# Patient Record
Sex: Female | Born: 1950
Health system: Southern US, Community
[De-identification: ages and names within clinical notes are randomized; demographics above are authoritative.]

## PROBLEM LIST (undated history)

## (undated) DIAGNOSIS — T7840XA Allergy, unspecified, initial encounter: Secondary | ICD-10-CM

## (undated) DIAGNOSIS — I10 Essential (primary) hypertension: Secondary | ICD-10-CM

## (undated) DIAGNOSIS — M1990 Unspecified osteoarthritis, unspecified site: Secondary | ICD-10-CM

## (undated) DIAGNOSIS — E785 Hyperlipidemia, unspecified: Secondary | ICD-10-CM

## (undated) DIAGNOSIS — K219 Gastro-esophageal reflux disease without esophagitis: Secondary | ICD-10-CM

## (undated) HISTORY — PX: COLONOSCOPY: SHX174

## (undated) HISTORY — DX: Allergy, unspecified, initial encounter: T78.40XA

## (undated) HISTORY — PX: WISDOM TOOTH EXTRACTION: SHX21

## (undated) HISTORY — DX: Gastro-esophageal reflux disease without esophagitis: K21.9

## (undated) HISTORY — DX: Unspecified osteoarthritis, unspecified site: M19.90

## (undated) HISTORY — DX: Hyperlipidemia, unspecified: E78.5

## (undated) HISTORY — PX: UPPER GASTROINTESTINAL ENDOSCOPY: SHX188

---

## 1999-05-22 ENCOUNTER — Encounter: Payer: Self-pay | Admitting: Emergency Medicine

## 1999-05-22 ENCOUNTER — Emergency Department (HOSPITAL_COMMUNITY): Admission: EM | Admit: 1999-05-22 | Discharge: 1999-05-22 | Payer: Self-pay | Admitting: Emergency Medicine

## 2001-02-16 ENCOUNTER — Encounter: Admission: RE | Admit: 2001-02-16 | Discharge: 2001-02-16 | Payer: Self-pay | Admitting: Family Medicine

## 2001-02-16 ENCOUNTER — Encounter: Payer: Self-pay | Admitting: Family Medicine

## 2003-11-02 ENCOUNTER — Emergency Department (HOSPITAL_COMMUNITY): Admission: EM | Admit: 2003-11-02 | Discharge: 2003-11-02 | Payer: Self-pay | Admitting: Emergency Medicine

## 2004-01-23 ENCOUNTER — Ambulatory Visit: Payer: Self-pay | Admitting: Internal Medicine

## 2004-01-31 ENCOUNTER — Ambulatory Visit: Payer: Self-pay | Admitting: Internal Medicine

## 2005-02-10 ENCOUNTER — Other Ambulatory Visit: Admission: RE | Admit: 2005-02-10 | Discharge: 2005-02-10 | Payer: Self-pay | Admitting: Family Medicine

## 2005-05-30 ENCOUNTER — Encounter: Admission: RE | Admit: 2005-05-30 | Discharge: 2005-05-30 | Payer: Self-pay | Admitting: Family Medicine

## 2006-03-01 IMAGING — CR DG CHEST 2V
2 series · 2 of 2 positions shown · non-contrast
Comparison: none

CLINICAL DATA: Mid chest pain.  Hypertension.
 PA AND LATERAL CHEST
 No comparison.  
 Heart size top normal. Minimal peribronchial thickening.  No significant infiltrate, or congestive heart failure.  No pneumothorax.
 IMPRESSION
 No evidence of infiltrate or congestive heart failure.

[view not recorded (1 of 2)]
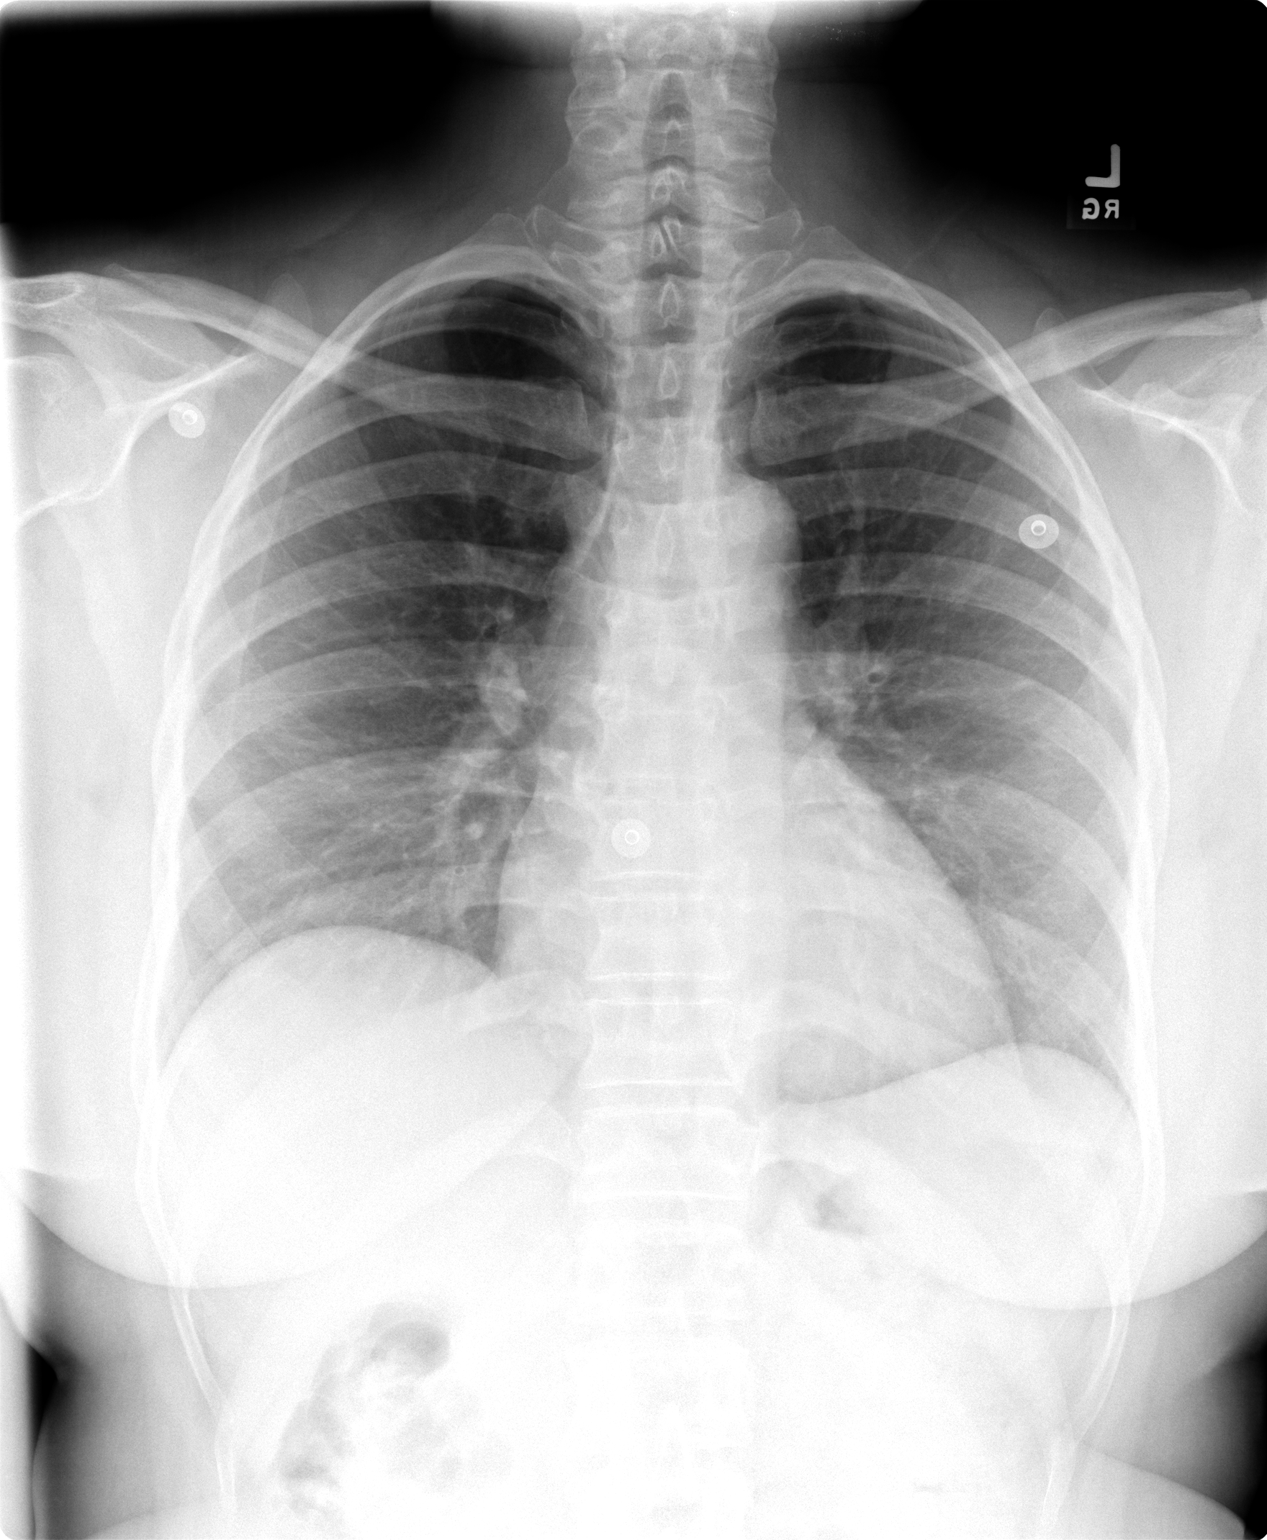

[view not recorded (2 of 2)]
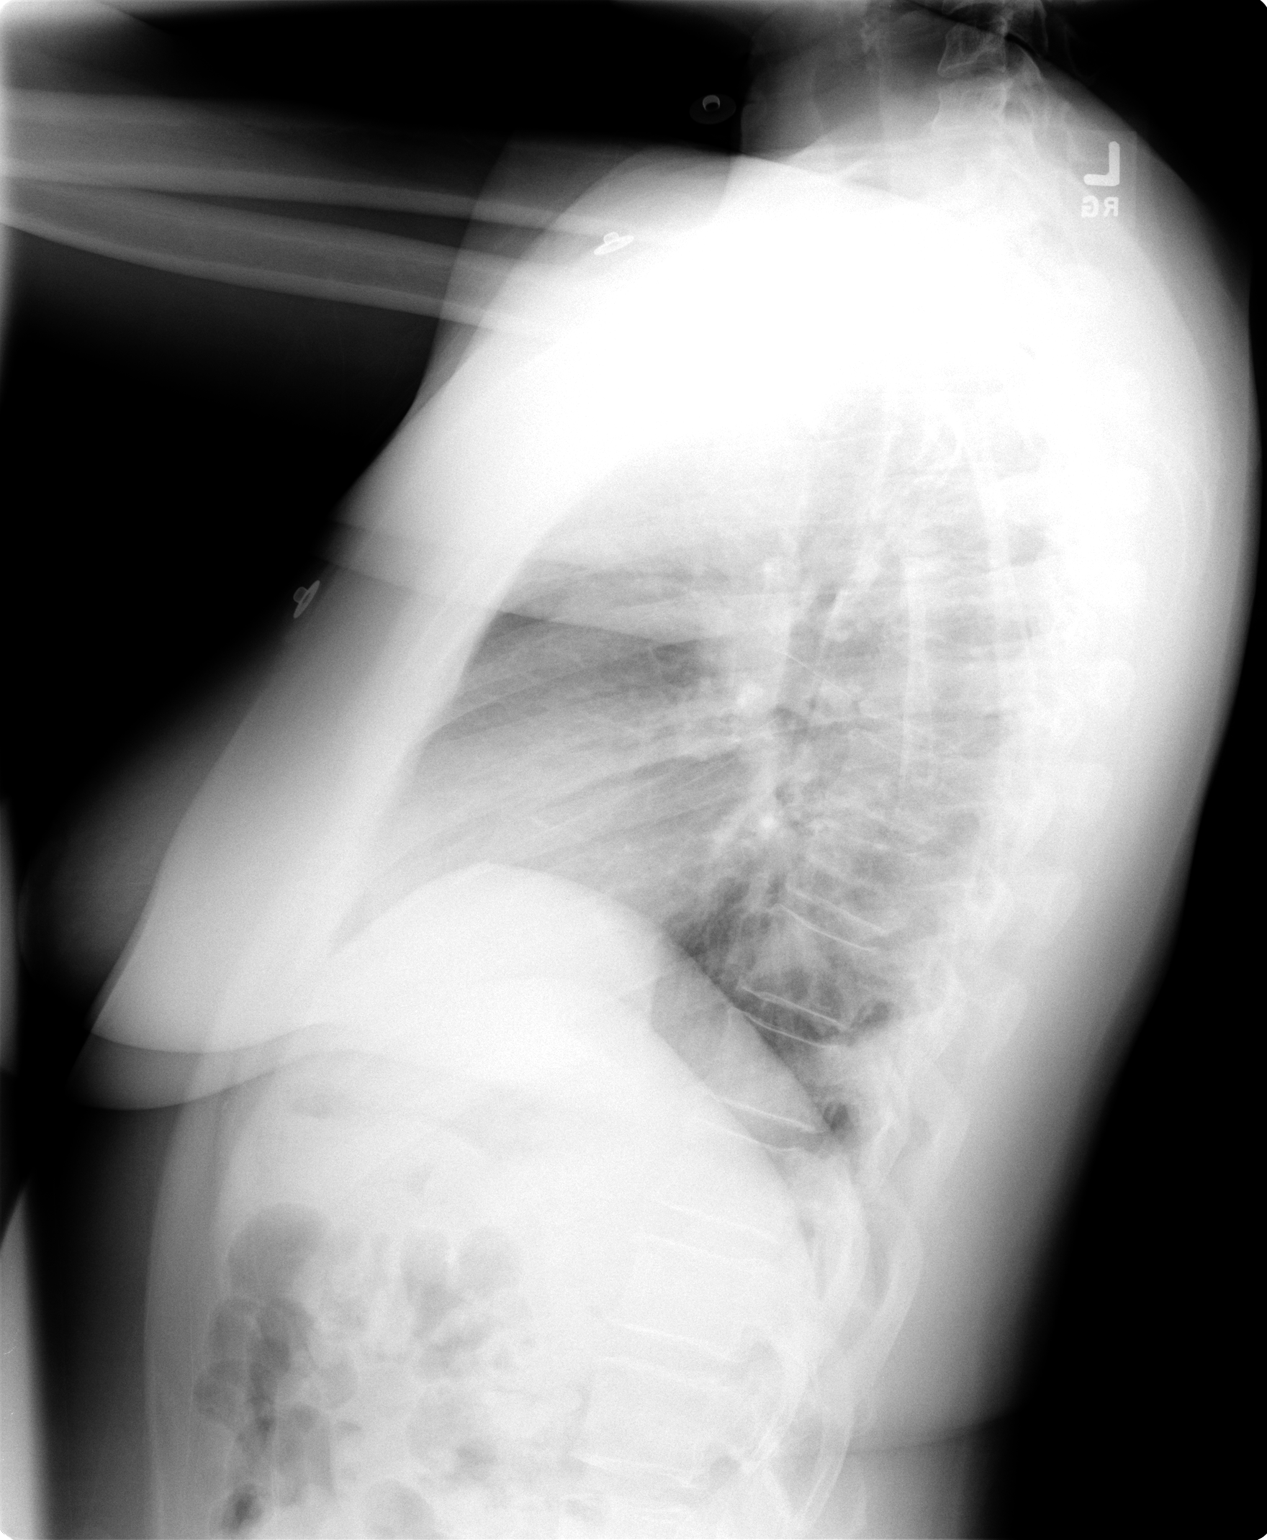

[2 of 2 positions shown; findings below may reference images not displayed]

## 2006-05-18 ENCOUNTER — Other Ambulatory Visit: Admission: RE | Admit: 2006-05-18 | Discharge: 2006-05-18 | Payer: Self-pay | Admitting: Family Medicine

## 2007-08-05 ENCOUNTER — Other Ambulatory Visit: Admission: RE | Admit: 2007-08-05 | Discharge: 2007-08-05 | Payer: Self-pay | Admitting: Family Medicine

## 2008-06-20 ENCOUNTER — Other Ambulatory Visit: Admission: RE | Admit: 2008-06-20 | Discharge: 2008-06-20 | Payer: Self-pay | Admitting: Family Medicine

## 2008-06-23 ENCOUNTER — Encounter: Admission: RE | Admit: 2008-06-23 | Discharge: 2008-06-23 | Payer: Self-pay | Admitting: Family Medicine

## 2009-12-30 ENCOUNTER — Emergency Department (HOSPITAL_COMMUNITY): Admission: EM | Admit: 2009-12-30 | Discharge: 2009-12-31 | Payer: Self-pay | Admitting: Emergency Medicine

## 2010-05-22 LAB — DIFFERENTIAL
Eosinophils Relative: 0 % (ref 0–5)
Lymphocytes Relative: 12 % (ref 12–46)
Lymphs Abs: 1.9 10*3/uL (ref 0.7–4.0)
Monocytes Absolute: 0.9 10*3/uL (ref 0.1–1.0)
Monocytes Relative: 5 % (ref 3–12)

## 2010-05-22 LAB — POCT I-STAT, CHEM 8
BUN: 14 mg/dL (ref 6–23)
Calcium, Ion: 1.12 mmol/L (ref 1.12–1.32)
Chloride: 101 mEq/L (ref 96–112)
Creatinine, Ser: 1.2 mg/dL (ref 0.4–1.2)
Glucose, Bld: 179 mg/dL — ABNORMAL HIGH (ref 70–99)
TCO2: 25 mmol/L (ref 0–100)

## 2010-05-22 LAB — CBC
HCT: 37.2 % (ref 36.0–46.0)
Hemoglobin: 13 g/dL (ref 12.0–15.0)
MCH: 33.9 pg (ref 26.0–34.0)
MCHC: 34.9 g/dL (ref 30.0–36.0)
MCV: 97.1 fL (ref 78.0–100.0)
Platelets: 203 10*3/uL (ref 150–400)
RBC: 3.83 MIL/uL — ABNORMAL LOW (ref 3.87–5.11)
RDW: 13 % (ref 11.5–15.5)
WBC: 16.8 10*3/uL — ABNORMAL HIGH (ref 4.0–10.5)

## 2010-10-25 ENCOUNTER — Other Ambulatory Visit: Payer: Self-pay | Admitting: Family Medicine

## 2010-10-25 DIAGNOSIS — Z1231 Encounter for screening mammogram for malignant neoplasm of breast: Secondary | ICD-10-CM

## 2010-10-25 DIAGNOSIS — Z780 Asymptomatic menopausal state: Secondary | ICD-10-CM

## 2010-11-19 ENCOUNTER — Other Ambulatory Visit: Payer: Self-pay

## 2010-11-19 ENCOUNTER — Ambulatory Visit: Payer: Self-pay

## 2010-11-27 ENCOUNTER — Ambulatory Visit
Admission: RE | Admit: 2010-11-27 | Discharge: 2010-11-27 | Disposition: A | Discharge status: Home / Self Care | Payer: BC Managed Care – PPO | Source: Ambulatory Visit | Attending: Family Medicine | Admitting: Family Medicine

## 2010-11-27 DIAGNOSIS — Z780 Asymptomatic menopausal state: Secondary | ICD-10-CM

## 2010-11-27 DIAGNOSIS — Z1231 Encounter for screening mammogram for malignant neoplasm of breast: Secondary | ICD-10-CM

## 2013-02-15 ENCOUNTER — Other Ambulatory Visit (HOSPITAL_COMMUNITY)
Admission: RE | Admit: 2013-02-15 | Discharge: 2013-02-15 | Disposition: A | Discharge status: Home / Self Care | Payer: BC Managed Care – PPO | Source: Ambulatory Visit | Attending: Family Medicine | Admitting: Family Medicine

## 2013-02-15 ENCOUNTER — Other Ambulatory Visit: Payer: Self-pay | Admitting: Family Medicine

## 2013-02-15 DIAGNOSIS — Z0000 Encounter for general adult medical examination without abnormal findings: Secondary | ICD-10-CM | POA: Insufficient documentation

## 2013-02-25 ENCOUNTER — Other Ambulatory Visit: Payer: Self-pay | Admitting: Obstetrics & Gynecology

## 2014-01-26 ENCOUNTER — Emergency Department (INDEPENDENT_AMBULATORY_CARE_PROVIDER_SITE_OTHER)
Admission: EM | Admit: 2014-01-26 | Discharge: 2014-01-26 | Disposition: A | Discharge status: Home / Self Care | Payer: BC Managed Care – PPO | Source: Home / Self Care | Attending: Emergency Medicine | Admitting: Emergency Medicine

## 2014-01-26 ENCOUNTER — Encounter (HOSPITAL_COMMUNITY): Payer: Self-pay | Admitting: *Deleted

## 2014-01-26 DIAGNOSIS — H6012 Cellulitis of left external ear: Secondary | ICD-10-CM

## 2014-01-26 HISTORY — DX: Essential (primary) hypertension: I10

## 2014-01-26 MED ORDER — CLOTRIMAZOLE-BETAMETHASONE 1-0.05 % EX CREA: TOPICAL_CREAM | CUTANEOUS | Status: DC

## 2014-01-26 MED ORDER — CLINDAMYCIN HCL 300 MG PO CAPS: 300.0000 mg | ORAL_CAPSULE | Freq: Three times a day (TID) | ORAL | Status: DC

## 2014-01-26 NOTE — ED Provider Notes (Signed)
CSN: [id number]     Arrival date & time 01/26/14  1028 History   First MD Initiated Contact with Patient 01/26/14 1049     Chief Complaint  Patient presents with  . Otalgia   (Consider location/radiation/quality/duration/timing/severity/associated sxs/prior Treatment) HPI         63 year old female presents complaining of right ear pain, swelling, and drainage. This started as a small red spot on the outside of her right ear 3 weeks ago. This has spread around the entire area behind her ear and there is redness spreading down onto her neck below her ear. The area is draining, and minimally painful and tender. No systemic symptoms. No treatment tried at home.  Past Medical History  Diagnosis Date  . Hypertension    History reviewed. No pertinent past surgical history. History reviewed. No pertinent family history. History  Substance Use Topics  . Smoking status: Current Every Day Smoker  . Smokeless tobacco: Not on file  . Alcohol Use: No   OB History    No data available     Review of Systems  Constitutional: Negative for fever and chills.  HENT: Positive for ear pain (and swelling).   Neurological: Negative for headaches.  All other systems reviewed and are negative.   Allergies  Penicillins  Home Medications   Prior to Admission medications   Medication Sig Start Date End Date Taking? Authorizing Provider  clindamycin (CLEOCIN) 300 MG capsule Take 1 capsule (300 mg total) by mouth 3 (three) times daily. 01/26/14   Liam Graham, PA-C  clotrimazole-betamethasone (LOTRISONE) cream Apply to affected area 2 times daily prn 01/26/14   Liam Graham, PA-C   BP 170/93 mmHg  Pulse 78  Temp(Src) 98.9 F (37.2 C) (Oral)  Resp 16  SpO2 98% Physical Exam  Constitutional: She is oriented to person, place, and time. Vital signs are normal. She appears well-developed and well-nourished. No distress.  HENT:  Head: Normocephalic and atraumatic.  Left Ear: Hearing, tympanic  membrane and ear canal normal. There is drainage and swelling (the external ear is swollen, with drainage behind the ear. This skin on the ear is ulcerated with crusting. There is erythema extending inferiorly to the ear).  Pulmonary/Chest: Effort normal. No respiratory distress.  Lymphadenopathy:       Head (left side): No preauricular and no posterior auricular adenopathy present.    She has no cervical adenopathy.  Neurological: She is alert and oriented to person, place, and time. She has normal strength. Coordination normal.  Skin: Skin is warm and dry. No rash noted. She is not diaphoretic.  Psychiatric: She has a normal mood and affect. Judgment normal.  Nursing note and vitals reviewed.   ED Course  Procedures (including critical care time) Labs Review Labs Reviewed  WOUND CULTURE    Imaging Review No results found.   MDM   1. Cellulitis of ear, left    Cellulitis with superficial skin infection versus fungal infection, treat with Lotrisone and with clindamycin. Wound culture has been sent. Follow-up if any worsening for incomplete resolution by the end of the antibiotics  Meds ordered this encounter  Medications  . clindamycin (CLEOCIN) 300 MG capsule    Sig: Take 1 capsule (300 mg total) by mouth 3 (three) times daily.    Dispense:  21 capsule    Refill:  0    Order Specific Question:  Supervising Provider    Answer:  Billy Fischer [phone]  . clotrimazole-betamethasone (LOTRISONE) cream  Sig: Apply to affected area 2 times daily prn    Dispense:  30 g    Refill:  0    Order Specific Question:  Supervising Provider    Answer:  Ihor Gully D [place], PA-C 01/26/14 1420

## 2014-01-26 NOTE — ED Notes (Signed)
l  Earache  With  Swelling   X      3   Weeks

## 2014-01-26 NOTE — Discharge Instructions (Signed)

## 2014-01-28 LAB — WOUND CULTURE: GRAM STAIN: NONE SEEN

## 2014-01-29 NOTE — ED Notes (Addendum)
Wound culture L ear: few staph species ( coagulase neg.).  Pt. treated with Cleocin.  OK per Archie Balboa PA. Roselyn Meier 01/29/2014

## 2014-02-20 ENCOUNTER — Encounter: Payer: Self-pay | Admitting: Internal Medicine

## 2014-05-30 ENCOUNTER — Other Ambulatory Visit (INDEPENDENT_AMBULATORY_CARE_PROVIDER_SITE_OTHER): Payer: BLUE CROSS/BLUE SHIELD

## 2014-05-30 ENCOUNTER — Ambulatory Visit (INDEPENDENT_AMBULATORY_CARE_PROVIDER_SITE_OTHER): Payer: BLUE CROSS/BLUE SHIELD | Admitting: Internal Medicine

## 2014-05-30 ENCOUNTER — Encounter: Payer: Self-pay | Admitting: Internal Medicine

## 2014-05-30 VITALS — BP 170/70 | HR 85 | Temp 98.3°F | Resp 18 | Ht 66.0 in | Wt 145.8 lb

## 2014-05-30 DIAGNOSIS — E785 Hyperlipidemia, unspecified: Secondary | ICD-10-CM

## 2014-05-30 DIAGNOSIS — Z1239 Encounter for other screening for malignant neoplasm of breast: Secondary | ICD-10-CM | POA: Diagnosis not present

## 2014-05-30 DIAGNOSIS — Z1211 Encounter for screening for malignant neoplasm of colon: Secondary | ICD-10-CM | POA: Diagnosis not present

## 2014-05-30 DIAGNOSIS — F329 Major depressive disorder, single episode, unspecified: Secondary | ICD-10-CM

## 2014-05-30 DIAGNOSIS — I10 Essential (primary) hypertension: Secondary | ICD-10-CM

## 2014-05-30 DIAGNOSIS — F32A Depression, unspecified: Secondary | ICD-10-CM

## 2014-05-30 LAB — COMPREHENSIVE METABOLIC PANEL
ALBUMIN: 4 g/dL (ref 3.5–5.2)
ALK PHOS: 74 U/L (ref 39–117)
ALT: 28 U/L (ref 0–35)
AST: 28 U/L (ref 0–37)
BILIRUBIN TOTAL: 0.5 mg/dL (ref 0.2–1.2)
BUN: 11 mg/dL (ref 6–23)
CO2: 30 mEq/L (ref 19–32)
Calcium: 9.7 mg/dL (ref 8.4–10.5)
Chloride: 104 mEq/L (ref 96–112)
Creatinine, Ser: 0.78 mg/dL (ref 0.40–1.20)
GFR: 95.31 mL/min (ref >60.00)
Glucose, Bld: 94 mg/dL (ref 70–99)
POTASSIUM: 4.1 meq/L (ref 3.5–5.1)
Sodium: 137 mEq/L (ref 135–145)
Total Protein: 8.3 g/dL (ref 6.0–8.3)

## 2014-05-30 LAB — LIPID PANEL
CHOLESTEROL: 188 mg/dL (ref 0–200)
HDL: 57.6 mg/dL (ref >39.00)
LDL Cholesterol: 120 mg/dL — ABNORMAL HIGH (ref 0–99)
NONHDL: 130.4
TRIGLYCERIDES: 54 mg/dL (ref 0.0–149.0)
Total CHOL/HDL Ratio: 3
VLDL: 10.8 mg/dL (ref 0.0–40.0)

## 2014-05-30 LAB — CBC
HEMATOCRIT: 38.2 % (ref 36.0–46.0)
HEMOGLOBIN: 13.1 g/dL (ref 12.0–15.0)
MCHC: 34.4 g/dL (ref 30.0–36.0)
MCV: 97.7 fl (ref 78.0–100.0)
PLATELETS: 204 10*3/uL (ref 150.0–400.0)
RBC: 3.91 Mil/uL (ref 3.87–5.11)
RDW: 14 % (ref 11.5–15.5)
WBC: 8.9 10*3/uL (ref 4.0–10.5)

## 2014-05-30 MED ORDER — LISINOPRIL-HYDROCHLOROTHIAZIDE 20-12.5 MG PO TABS: 1.0000 | ORAL_TABLET | Freq: Every day | ORAL | Status: DC

## 2014-05-30 MED ORDER — CITALOPRAM HYDROBROMIDE 20 MG PO TABS: 20.0000 mg | ORAL_TABLET | Freq: Every day | ORAL | Status: DC

## 2014-05-30 MED ORDER — ATORVASTATIN CALCIUM 20 MG PO TABS: 20.0000 mg | ORAL_TABLET | Freq: Every day | ORAL | Status: DC

## 2014-05-30 NOTE — Patient Instructions (Signed)
We have sent in the blood pressure, cholesterol, and the celexa for you today.   We will check on the blood work today to make sure they are still doing well.   We will see you back in about 3-4 months to make sure you are doing well on the medicines.   We have sent in for the mammogram and you should hear back about that.   Health Maintenance Adopting a healthy lifestyle and getting preventive care can go a long way to promote health and wellness. Talk with your health care provider about what schedule of regular examinations is right for you. This is a good chance for you to check in with your provider about disease prevention and staying healthy. In between checkups, there are plenty of things you can do on your own. Experts have done a lot of research about which lifestyle changes and preventive measures are most likely to keep you healthy. Ask your health care provider for more information. WEIGHT AND DIET  Eat a healthy diet  Be sure to include plenty of vegetables, fruits, low-fat dairy products, and lean protein.  Do not eat a lot of foods high in solid fats, added sugars, or salt.  Get regular exercise. This is one of the most important things you can do for your health.  Most adults should exercise for at least 150 minutes each week. The exercise should increase your heart rate and make you sweat (moderate-intensity exercise).  Most adults should also do strengthening exercises at least twice a week. This is in addition to the moderate-intensity exercise.  Maintain a healthy weight  Body mass index (BMI) is a measurement that can be used to identify possible weight problems. It estimates body fat based on height and weight. Your health care provider can help determine your BMI and help you achieve or maintain a healthy weight.  For females 27 years of age and older:   A BMI below 18.5 is considered underweight.  A BMI of 18.5 to 24.9 is normal.  A BMI of 25 to 29.9 is  considered overweight.  A BMI of 30 and above is considered obese.  Watch levels of cholesterol and blood lipids  You should start having your blood tested for lipids and cholesterol at 65 years of age, then have this test every 5 years.  You may need to have your cholesterol levels checked more often if:  Your lipid or cholesterol levels are high.  You are older than 65 years of age.  You are at high risk for heart disease.  CANCER SCREENING   Lung Cancer  Lung cancer screening is recommended for adults 28-38 years old who are at high risk for lung cancer because of a history of smoking.  A yearly low-dose CT scan of the lungs is recommended for people who:  Currently smoke.  Have quit within the past 15 years.  Have at least a 30-pack-year history of smoking. A pack year is smoking an average of one pack of cigarettes a day for 1 year.  Yearly screening should continue until it has been 15 years since you quit.  Yearly screening should stop if you develop a health problem that would prevent you from having lung cancer treatment.  Breast Cancer  Practice breast self-awareness. This means understanding how your breasts normally appear and feel.  It also means doing regular breast self-exams. Let your health care provider know about any changes, no matter how small.  If you are in  your 20s or 30s, you should have a clinical breast exam (CBE) by a health care provider every 1-3 years as part of a regular health exam.  If you are 27 or older, have a CBE every year. Also consider having a breast X-ray (mammogram) every year.  If you have a family history of breast cancer, talk to your health care provider about genetic screening.  If you are at high risk for breast cancer, talk to your health care provider about having an MRI and a mammogram every year.  Breast cancer gene (BRCA) assessment is recommended for women who have family members with BRCA-related cancers.  BRCA-related cancers include:  Breast.  Ovarian.  Tubal.  Peritoneal cancers.  Results of the assessment will determine the need for genetic counseling and BRCA1 and BRCA2 testing. Cervical Cancer Routine pelvic examinations to screen for cervical cancer are no longer recommended for nonpregnant women who are considered low risk for cancer of the pelvic organs (ovaries, uterus, and vagina) and who do not have symptoms. A pelvic examination may be necessary if you have symptoms including those associated with pelvic infections. Ask your health care provider if a screening pelvic exam is right for you.   The Pap test is the screening test for cervical cancer for women who are considered at risk.  If you had a hysterectomy for a problem that was not cancer or a condition that could lead to cancer, then you no longer need Pap tests.  If you are older than 65 years, and you have had normal Pap tests for the past 10 years, you no longer need to have Pap tests.  If you have had past treatment for cervical cancer or a condition that could lead to cancer, you need Pap tests and screening for cancer for at least 20 years after your treatment.  If you no longer get a Pap test, assess your risk factors if they change (such as having a new sexual partner). This can affect whether you should start being screened again.  Some women have medical problems that increase their chance of getting cervical cancer. If this is the case for you, your health care provider may recommend more frequent screening and Pap tests.  The human papillomavirus (HPV) test is another test that may be used for cervical cancer screening. The HPV test looks for the virus that can cause cell changes in the cervix. The cells collected during the Pap test can be tested for HPV.  The HPV test can be used to screen women 39 years of age and older. Getting tested for HPV can extend the interval between normal Pap tests from three to  five years.  An HPV test also should be used to screen women of any age who have unclear Pap test results.  After 65 years of age, women should have HPV testing as often as Pap tests.  Colorectal Cancer  This type of cancer can be detected and often prevented.  Routine colorectal cancer screening usually begins at 65 years of age and continues through 65 years of age.  Your health care provider may recommend screening at an earlier age if you have risk factors for colon cancer.  Your health care provider may also recommend using home test kits to check for hidden blood in the stool.  A small camera at the end of a tube can be used to examine your colon directly (sigmoidoscopy or colonoscopy). This is done to check for the earliest forms of  colorectal cancer.  Routine screening usually begins at age 18.  Direct examination of the colon should be repeated every 5-10 years through 65 years of age. However, you may need to be screened more often if early forms of precancerous polyps or small growths are found. Skin Cancer  Check your skin from head to toe regularly.  Tell your health care provider about any new moles or changes in moles, especially if there is a change in a mole's shape or color.  Also tell your health care provider if you have a mole that is larger than the size of a pencil eraser.  Always use sunscreen. Apply sunscreen liberally and repeatedly throughout the day.  Protect yourself by wearing long sleeves, pants, a wide-brimmed hat, and sunglasses whenever you are outside. HEART DISEASE, DIABETES, AND HIGH BLOOD PRESSURE   Have your blood pressure checked at least every 1-2 years. High blood pressure causes heart disease and increases the risk of stroke.  If you are between 34 years and 64 years old, ask your health care provider if you should take aspirin to prevent strokes.  Have regular diabetes screenings. This involves taking a blood sample to check your  fasting blood sugar level.  If you are at a normal weight and have a low risk for diabetes, have this test once every three years after 65 years of age.  If you are overweight and have a high risk for diabetes, consider being tested at a younger age or more often. PREVENTING INFECTION  Hepatitis B  If you have a higher risk for hepatitis B, you should be screened for this virus. You are considered at high risk for hepatitis B if:  You were born in a country where hepatitis B is common. Ask your health care provider which countries are considered high risk.  Your parents were born in a high-risk country, and you have not been immunized against hepatitis B (hepatitis B vaccine).  You have HIV or AIDS.  You use needles to inject street drugs.  You live with someone who has hepatitis B.  You have had sex with someone who has hepatitis B.  You get hemodialysis treatment.  You take certain medicines for conditions, including cancer, organ transplantation, and autoimmune conditions. Hepatitis C  Blood testing is recommended for:  Everyone born from [birth year] through 1966.  Anyone with known risk factors for hepatitis C. Sexually transmitted infections (STIs)  You should be screened for sexually transmitted infections (STIs) including gonorrhea and chlamydia if:  You are sexually active and are younger than 65 years of age.  You are older than 65 years of age and your health care provider tells you that you are at risk for this type of infection.  Your sexual activity has changed since you were last screened and you are at an increased risk for chlamydia or gonorrhea. Ask your health care provider if you are at risk.  If you do not have HIV, but are at risk, it may be recommended that you take a prescription medicine daily to prevent HIV infection. This is called pre-exposure prophylaxis (PrEP). You are considered at risk if:  You are sexually active and do not regularly use condoms or  know the HIV status of your partner(s).  You take drugs by injection.  You are sexually active with a partner who has HIV. Talk with your health care provider about whether you are at high risk of being infected with HIV. If you choose to begin PrEP,  you should first be tested for HIV. You should then be tested every 3 months for as long as you are taking PrEP.  PREGNANCY   If you are premenopausal and you may become pregnant, ask your health care provider about preconception counseling.  If you may become pregnant, take 400 to 800 micrograms (mcg) of folic acid every day.  If you want to prevent pregnancy, talk to your health care provider about birth control (contraception). OSTEOPOROSIS AND MENOPAUSE   Osteoporosis is a disease in which the bones lose minerals and strength with aging. This can result in serious bone fractures. Your risk for osteoporosis can be identified using a bone density scan.  If you are 75 years of age or older, or if you are at risk for osteoporosis and fractures, ask your health care provider if you should be screened.  Ask your health care provider whether you should take a calcium or vitamin D supplement to lower your risk for osteoporosis.  Menopause may have certain physical symptoms and risks.  Hormone replacement therapy may reduce some of these symptoms and risks. Talk to your health care provider about whether hormone replacement therapy is right for you.  HOME CARE INSTRUCTIONS   Schedule regular health, dental, and eye exams.  Stay current with your immunizations.   Do not use any tobacco products including cigarettes, chewing tobacco, or electronic cigarettes.  If you are pregnant, do not drink alcohol.  If you are breastfeeding, limit how much and how often you drink alcohol.  Limit alcohol intake to no more than 1 drink per day for nonpregnant women. One drink equals 12 ounces of beer, 5 ounces of wine, or 1 ounces of hard liquor.  Do  not use street drugs.  Do not share needles.  Ask your health care provider for help if you need support or information about quitting drugs.  Tell your health care provider if you often feel depressed.  Tell your health care provider if you have ever been abused or do not feel safe at home. Document Released: 09/09/2010 Document Revised: 07/11/2013 Document Reviewed: 01/26/2013 [facility] Patient Information 2015 [place], [place]. This information is not intended to replace advice given to you by your health care provider. Make sure you discuss any questions you have with your health care provider.

## 2014-05-30 NOTE — Progress Notes (Signed)
Pre visit review using our clinic review tool, if applicable. No additional management support is needed unless otherwise documented below in the visit note. 

## 2014-06-01 DIAGNOSIS — F325 Major depressive disorder, single episode, in full remission: Secondary | ICD-10-CM | POA: Insufficient documentation

## 2014-06-01 DIAGNOSIS — E785 Hyperlipidemia, unspecified: Secondary | ICD-10-CM | POA: Insufficient documentation

## 2014-06-01 DIAGNOSIS — I10 Essential (primary) hypertension: Secondary | ICD-10-CM | POA: Insufficient documentation

## 2014-06-01 DIAGNOSIS — F329 Major depressive disorder, single episode, unspecified: Secondary | ICD-10-CM | POA: Insufficient documentation

## 2014-06-01 NOTE — Assessment & Plan Note (Signed)
Refilled her lisinopril/hctz today. Will check labs today to make sure no contraindications and change if necessary. Will see her back shortly to recheck BP on regimen. May need further adjustment.

## 2014-06-01 NOTE — Assessment & Plan Note (Signed)
Will check lipid panel but likely uncontrolled off her medication. Will refill her atorvastatin and adjust if needed based on labs.

## 2014-06-01 NOTE — Assessment & Plan Note (Signed)
Has felt worse being off and wants to go back on celexa 20 mg daily. Refilled today and will see back to make sure she is doing well back on it.

## 2014-06-01 NOTE — Progress Notes (Signed)
   Subjective:    Patient ID: Kendra Jordan, female    DOB: 03-Dec-1950, 64 y.o.   MRN: [id number]  HPI The patient is a 64 YO female who is coming in for high blood pressure. She has been treated for it for years. Due to lapse in provider she has been out of medication for several months. During that time she has had more headaches. Denies chest pains, numbness, nausea. Denies headache today. Brought in the old bottle and thinks she was well controlled on that in the past.   PMH, [facility], social history reviewed and updated during this visit.   Review of Systems  Constitutional: Negative for fever, activity change, appetite change, fatigue and unexpected weight change.  Eyes: Negative.   Respiratory: Negative for cough, chest tightness, shortness of breath and wheezing.   Cardiovascular: Negative for chest pain, palpitations and leg swelling.  Gastrointestinal: Negative for abdominal pain, diarrhea, constipation and abdominal distention.  Musculoskeletal: Negative.   Neurological: Positive for headaches. Negative for dizziness, syncope, weakness, light-headedness and numbness.  Psychiatric/Behavioral: Negative.       Objective:   Physical Exam  Constitutional: She is oriented to person, place, and time. She appears well-developed and well-nourished.  HENT:  Head: Normocephalic and atraumatic.  Eyes: EOM are normal. Pupils are equal, round, and reactive to light.  Neck: Normal range of motion.  Cardiovascular: Normal rate and regular rhythm.   No murmur heard. Pulmonary/Chest: Effort normal and breath sounds normal.  Abdominal: Soft. Bowel sounds are normal.  Musculoskeletal: She exhibits no edema.  Neurological: She is alert and oriented to person, place, and time. Coordination normal.  Skin: Skin is warm and dry.  Psychiatric: She has a normal mood and affect.   Filed Vitals:   05/30/14 1050  BP: 170/70  Pulse: 85  Temp: 98.3 F (36.8 C)  TempSrc: Oral  Resp: 18  Height: 5'  6" (1.676 m)  Weight: 145 lb 12.8 oz (66.134 kg)  SpO2: 97%      Assessment & Plan:

## 2014-06-12 ENCOUNTER — Telehealth: Payer: Self-pay | Admitting: Internal Medicine

## 2014-06-12 ENCOUNTER — Encounter: Payer: Self-pay | Admitting: Internal Medicine

## 2014-06-12 NOTE — Telephone Encounter (Signed)
Pt called in said that she was returning your call.  I didn't see that you called her  Best number [phone]

## 2014-06-12 NOTE — Telephone Encounter (Signed)
Spoke with patient about lab results.

## 2014-06-19 ENCOUNTER — Encounter: Payer: Self-pay | Admitting: Internal Medicine

## 2014-08-10 ENCOUNTER — Ambulatory Visit (AMBULATORY_SURGERY_CENTER): Payer: Self-pay

## 2014-08-10 VITALS — Ht 66.0 in | Wt 145.0 lb

## 2014-08-10 DIAGNOSIS — Z1211 Encounter for screening for malignant neoplasm of colon: Secondary | ICD-10-CM

## 2014-08-10 NOTE — Progress Notes (Signed)
No allergies to eggs or soy No diet/weight loss meds No home oxygen No past exposure to anesthesia other than colonoscopy 10+ yrs ago  No email

## 2014-08-16 ENCOUNTER — Encounter: Payer: Self-pay | Admitting: Internal Medicine

## 2014-08-16 ENCOUNTER — Other Ambulatory Visit: Payer: Self-pay | Admitting: Internal Medicine

## 2014-08-24 ENCOUNTER — Encounter: Payer: Self-pay | Admitting: Internal Medicine

## 2014-08-24 ENCOUNTER — Ambulatory Visit (AMBULATORY_SURGERY_CENTER): Payer: BLUE CROSS/BLUE SHIELD | Admitting: Internal Medicine

## 2014-08-24 VITALS — BP 157/59 | HR 52 | Temp 98.2°F | Resp 16 | Ht 66.0 in | Wt 145.0 lb

## 2014-08-24 DIAGNOSIS — D125 Benign neoplasm of sigmoid colon: Secondary | ICD-10-CM

## 2014-08-24 DIAGNOSIS — K635 Polyp of colon: Secondary | ICD-10-CM | POA: Diagnosis not present

## 2014-08-24 DIAGNOSIS — D124 Benign neoplasm of descending colon: Secondary | ICD-10-CM

## 2014-08-24 DIAGNOSIS — Z1211 Encounter for screening for malignant neoplasm of colon: Secondary | ICD-10-CM

## 2014-08-24 MED ORDER — SODIUM CHLORIDE 0.9 % IV SOLN: 500.0000 mL | INTRAVENOUS | Status: DC

## 2014-08-24 NOTE — Patient Instructions (Addendum)
I found and removed 3 tiny polyps - not cancer. 1 of the 3 will be sent to pathology exam, the other 2 were destroyed when taking them off.  I will let you know pathology results and when to have another routine colonoscopy by mail.  I appreciate the opportunity to care for you. Gatha Mayer, MD, FACG  YOU HAD AN ENDOSCOPIC PROCEDURE TODAY AT [facility]:   Refer to the procedure report that was given to you for any specific questions about what was found during the examination.  If the procedure report does not answer your questions, please call your gastroenterologist to clarify.  If you requested that your care partner not be given the details of your procedure findings, then the procedure report has been included in a sealed envelope for you to review at your convenience later.  YOU SHOULD EXPECT: Some feelings of bloating in the abdomen. Passage of more gas than usual.  Walking can help get rid of the air that was put into your GI tract during the procedure and reduce the bloating. If you had a lower endoscopy (such as a colonoscopy or flexible sigmoidoscopy) you may notice spotting of blood in your stool or on the toilet paper. If you underwent a bowel prep for your procedure, you may not have a normal bowel movement for a few days.  Please Note:  You might notice some irritation and congestion in your nose or some drainage.  This is from the oxygen used during your procedure.  There is no need for concern and it should clear up in a day or so.  SYMPTOMS TO REPORT IMMEDIATELY:   Following lower endoscopy (colonoscopy or flexible sigmoidoscopy):  Excessive amounts of blood in the stool  Significant tenderness or worsening of abdominal pains  Swelling of the abdomen that is new, acute  Fever of 100F or higher   For urgent or emergent issues, a gastroenterologist can be reached at any hour by calling [phone].   DIET: Your first meal following the  procedure should be a small meal and then it is ok to progress to your normal diet. Heavy or fried foods are harder to digest and may make you feel nauseous or bloated.  Likewise, meals heavy in dairy and vegetables can increase bloating.  Drink plenty of fluids but you should avoid alcoholic beverages for 24 hours.  ACTIVITY:  You should plan to take it easy for the rest of today and you should NOT DRIVE or use heavy machinery until tomorrow (because of the sedation medicines used during the test).    FOLLOW UP: Our staff will call the number listed on your records the next business day following your procedure to check on you and address any questions or concerns that you may have regarding the information given to you following your procedure. If we do not reach you, we will leave a message.  However, if you are feeling well and you are not experiencing any problems, there is no need to return our call.  We will assume that you have returned to your regular daily activities without incident.  If any biopsies were taken you will be contacted by phone or by letter within the next 1-3 weeks.  Please call us at [phone] if you have not heard about the biopsies in 3 weeks.    SIGNATURES/CONFIDENTIALITY: You and/or your care partner have signed paperwork which will be entered into your electronic medical record.  These  signatures attest to the fact that that the information above on your After Visit Summary has been reviewed and is understood.  Full responsibility of the confidentiality of this discharge information lies with you and/or your care-partner.  Read all handouts given to you by your recovery room nurse.

## 2014-08-24 NOTE — Op Note (Signed)
[place]  [address] [place], [address]   COLONOSCOPY PROCEDURE REPORT  PATIENT: Kendra Jordan, Kendra Jordan  MR#: [id number] BIRTHDATE: 1950/09/03 , 32  yrs. old GENDER: female ENDOSCOPIST: Gatha Mayer, MD, [facility] PROCEDURE DATE:  08/24/2014 PROCEDURE:   Colonoscopy, screening and Colonoscopy with snare polypectomy First Screening Colonoscopy - Avg.  risk and is 50 yrs.  old or older - No.  Prior Negative Screening - Now for repeat screening. 10 or more years since last screening  History of Adenoma - Now for follow-up colonoscopy & has been > or = to 3 yrs.  N/A  Polyps removed today? Yes ASA CLASS:   Class II INDICATIONS:Screening for colonic neoplasia and Colorectal Neoplasm Risk Assessment for this procedure is average risk. MEDICATIONS: Propofol 200 mg IV and Monitored anesthesia care  DESCRIPTION OF PROCEDURE:   After the risks benefits and alternatives of the procedure were thoroughly explained, informed consent was obtained.  The digital rectal exam revealed no abnormalities of the rectum.   The LB [id number]  endoscope was introduced through the anus and advanced to the cecum, which was identified by both the appendix and ileocecal valve. No adverse events experienced.   The quality of the prep was good.  (MiraLax was used)  The instrument was then slowly withdrawn as the colon was fully examined. Estimated blood loss is zero unless otherwise noted in this procedure report.      COLON FINDINGS: Three sessile polyps ranging from 2 to 17mm in size were found in the descending colon and sigmoid colon. Polypectomies were performed with a cold snare.  The resection was complete, the polyp tissue was partially retrieved and sent to histology.   The examination was otherwise normal.   Right colon retroflexion included.  Retroflexed views revealed no abnormalities. The time to cecum = 2.8 Withdrawal time = 11.4   The scope was withdrawn and the  procedure completed. COMPLICATIONS: There were no immediate complications.  ENDOSCOPIC IMPRESSION: 1.   Three sessile polyps ranging from 2 to 38mm in size were found in the descending colon and sigmoid colon; polypectomies were performed with a cold snare descending polyp (62mm) reciovered - others not. 2.   The examination was otherwise normal  RECOMMENDATIONS: Timing of repeat colonoscopy will be determined by pathology findings.  eSigned:  Gatha Mayer, MD, [facility] 08/24/2014 11:45 AM   cc: The Patient

## 2014-08-24 NOTE — Progress Notes (Signed)
To recovery, report to Hodges, RN, VSS 

## 2014-08-24 NOTE — Progress Notes (Signed)
No issues with eggs or soy No issues with last colon sedation

## 2014-08-24 NOTE — Progress Notes (Signed)
Called to room to assist during endoscopic procedure.  Patient ID and intended procedure confirmed with present staff. Received instructions for my participation in the procedure from the performing physician.  

## 2014-08-28 ENCOUNTER — Telehealth: Payer: Self-pay | Admitting: *Deleted

## 2014-08-28 NOTE — Telephone Encounter (Signed)
  Follow up Call-  Call back number 08/24/2014  Post procedure Call Back phone  # [phone]  Permission to leave phone message Yes     Patient questions:  Do you have a fever, pain , or abdominal swelling? No. Pain Score  0 *  Have you tolerated food without any problems? Yes.    Have you been able to return to your normal activities? Yes.    Do you have any questions about your discharge instructions: Diet   No. Medications  No. Follow up visit  No.  Do you have questions or concerns about your Care? No.  Actions: * If pain score is 4 or above: No action needed, pain <4.

## 2014-08-30 ENCOUNTER — Encounter: Payer: Self-pay | Admitting: Internal Medicine

## 2014-08-30 ENCOUNTER — Other Ambulatory Visit (INDEPENDENT_AMBULATORY_CARE_PROVIDER_SITE_OTHER): Payer: BLUE CROSS/BLUE SHIELD

## 2014-08-30 ENCOUNTER — Ambulatory Visit (INDEPENDENT_AMBULATORY_CARE_PROVIDER_SITE_OTHER): Payer: BLUE CROSS/BLUE SHIELD | Admitting: Internal Medicine

## 2014-08-30 VITALS — BP 136/62 | HR 68 | Temp 98.5°F | Resp 16 | Ht 66.0 in | Wt 146.6 lb

## 2014-08-30 DIAGNOSIS — I10 Essential (primary) hypertension: Secondary | ICD-10-CM

## 2014-08-30 DIAGNOSIS — F329 Major depressive disorder, single episode, unspecified: Secondary | ICD-10-CM | POA: Diagnosis not present

## 2014-08-30 DIAGNOSIS — F32A Depression, unspecified: Secondary | ICD-10-CM

## 2014-08-30 LAB — BASIC METABOLIC PANEL
BUN: 14 mg/dL (ref 6–23)
CO2: 31 mEq/L (ref 19–32)
Calcium: 9.6 mg/dL (ref 8.4–10.5)
Chloride: 102 mEq/L (ref 96–112)
Creatinine, Ser: 0.91 mg/dL (ref 0.40–1.20)
GFR: 79.71 mL/min (ref >60.00)
Glucose, Bld: 77 mg/dL (ref 70–99)
POTASSIUM: 3.9 meq/L (ref 3.5–5.1)
SODIUM: 138 meq/L (ref 135–145)

## 2014-08-30 MED ORDER — CITALOPRAM HYDROBROMIDE 40 MG PO TABS: 40.0000 mg | ORAL_TABLET | Freq: Every day | ORAL | Status: DC

## 2014-08-30 NOTE — Patient Instructions (Signed)
We will check the blood work today and call you back with the results.   We have called the citalopram to the pharmacy at the stronger dose. When you fill it take 1 pill a day (this replaces the 2 pills a day you were taking).   Come back in about 6 months, if you have any problems or questions sooner please feel free to call the office.   Smoking Cessation Quitting smoking is important to your health and has many advantages. However, it is not always easy to quit since nicotine is a very addictive drug. Oftentimes, people try 3 times or more before being able to quit. This document explains the best ways for you to prepare to quit smoking. Quitting takes hard work and a lot of effort, but you can do it. ADVANTAGES OF QUITTING SMOKING  You will live longer, feel better, and live better.  Your body will feel the impact of quitting smoking almost immediately.  Within 20 minutes, blood pressure decreases. Your pulse returns to its normal level.  After 8 hours, carbon monoxide levels in the blood return to normal. Your oxygen level increases.  After 24 hours, the chance of having a heart attack starts to decrease. Your breath, hair, and body stop smelling like smoke.  After 48 hours, damaged nerve endings begin to recover. Your sense of taste and smell improve.  After 72 hours, the body is virtually free of nicotine. Your bronchial tubes relax and breathing becomes easier.  After 2 to 12 weeks, lungs can hold more air. Exercise becomes easier and circulation improves.  The risk of having a heart attack, stroke, cancer, or lung disease is greatly reduced.  After 1 year, the risk of coronary heart disease is cut in half.  After 5 years, the risk of stroke falls to the same as a nonsmoker.  After 10 years, the risk of lung cancer is cut in half and the risk of other cancers decreases significantly.  After 15 years, the risk of coronary heart disease drops, usually to the level of a  nonsmoker.  If you are pregnant, quitting smoking will improve your chances of having a healthy baby.  The people you live with, especially any children, will be healthier.  You will have extra money to spend on things other than cigarettes. QUESTIONS TO THINK ABOUT BEFORE ATTEMPTING TO QUIT You may want to talk about your answers with your health care provider.  Why do you want to quit?  If you tried to quit in the past, what helped and what did not?  What will be the most difficult situations for you after you quit? How will you plan to handle them?  Who can help you through the tough times? Your family? Friends? A health care provider?  What pleasures do you get from smoking? What ways can you still get pleasure if you quit? Here are some questions to ask your health care provider:  How can you help me to be successful at quitting?  What medicine do you think would be best for me and how should I take it?  What should I do if I need more help?  What is smoking withdrawal like? How can I get information on withdrawal? GET READY  Set a quit date.  Change your environment by getting rid of all cigarettes, ashtrays, matches, and lighters in your home, car, or work. Do not let people smoke in your home.  Review your past attempts to quit. Think about  what worked and what did not. GET SUPPORT AND ENCOURAGEMENT You have a better chance of being successful if you have help. You can get support in many ways.  Tell your family, friends, and coworkers that you are going to quit and need their support. Ask them not to smoke around you.  Get individual, group, or telephone counseling and support. Programs are available at [employer] and health centers. Call your local health department for information about programs in your area.  Spiritual beliefs and practices may help some smokers quit.  Download a "quit meter" on your computer to keep track of quit statistics, such as how  long you have gone without smoking, cigarettes not smoked, and money saved.  Get a self-help book about quitting smoking and staying off tobacco. [place] yourself from urges to smoke. Talk to someone, go for a walk, or occupy your time with a task.  Change your normal routine. Take a different route to work. Drink tea instead of coffee. Eat breakfast in a different place.  Reduce your stress. Take a hot bath, exercise, or read a book.  Plan something enjoyable to do every day. Reward yourself for not smoking.  Explore interactive web-based programs that specialize in helping you quit. GET MEDICINE AND USE IT CORRECTLY Medicines can help you stop smoking and decrease the urge to smoke. Combining medicine with the above behavioral methods and support can greatly increase your chances of successfully quitting smoking.  Nicotine replacement therapy helps deliver nicotine to your body without the negative effects and risks of smoking. Nicotine replacement therapy includes nicotine gum, lozenges, inhalers, nasal sprays, and skin patches. Some may be available over-the-counter and others require a prescription.  Antidepressant medicine helps people abstain from smoking, but how this works is unknown. This medicine is available by prescription.  Nicotinic receptor partial agonist medicine simulates the effect of nicotine in your brain. This medicine is available by prescription. Ask your health care provider for advice about which medicines to use and how to use them based on your health history. Your health care provider will tell you what side effects to look out for if you choose to be on a medicine or therapy. Carefully read the information on the package. Do not use any other product containing nicotine while using a nicotine replacement product.  RELAPSE OR DIFFICULT SITUATIONS Most relapses occur within the first 3 months after quitting. Do not be discouraged  if you start smoking again. Remember, most people try several times before finally quitting. You may have symptoms of withdrawal because your body is used to nicotine. You may crave cigarettes, be irritable, feel very hungry, cough often, get headaches, or have difficulty concentrating. The withdrawal symptoms are only temporary. They are strongest when you first quit, but they will go away within 10-14 days. To reduce the chances of relapse, try to:  Avoid drinking alcohol. Drinking lowers your chances of successfully quitting.  Reduce the amount of caffeine you consume. Once you quit smoking, the amount of caffeine in your body increases and can give you symptoms, such as a rapid heartbeat, sweating, and anxiety.  Avoid smokers because they can make you want to smoke.  Do not let weight gain distract you. Many smokers will gain weight when they quit, usually less than 10 pounds. Eat a healthy diet and stay active. You can always lose the weight gained after you quit.  Find ways to improve your mood other than smoking.  FOR MORE INFORMATION  www.smokefree.gov  Document Released: 02/18/2001 Document Revised: 07/11/2013 Document Reviewed: 06/05/2011 [facility] Patient Information 2015 [place], [place]. This information is not intended to replace advice given to you by your health care provider. Make sure you discuss any questions you have with your health care provider.

## 2014-08-30 NOTE — Progress Notes (Signed)
   Subjective:    Patient ID: Kendra Jordan, female    DOB: [birth year], 64 y.o.   MRN: [id number]  HPI The patient is coming back in to follow up on her depression and her blood pressure. We started citalopram on her which has helped with her depression, she has increased as discussed to 40 mg daily without adverse effects. Doing much better with her depression and rates it mild now compared to severe before. Her blood pressure medicine is doing well and without side effects. BP good today. No headaches, chest pains, SOB.   Review of Systems  Constitutional: Negative for fever, activity change, appetite change, fatigue and unexpected weight change.  Eyes: Negative.   Respiratory: Negative for cough, chest tightness, shortness of breath and wheezing.   Cardiovascular: Negative for chest pain, palpitations and leg swelling.  Gastrointestinal: Negative for abdominal pain, diarrhea, constipation and abdominal distention.  Musculoskeletal: Negative.   Neurological: Negative for dizziness, syncope, weakness, light-headedness, numbness and headaches.  Psychiatric/Behavioral: Negative.       Objective:   Physical Exam  Constitutional: She is oriented to person, place, and time. She appears well-developed and well-nourished.  HENT:  Head: Normocephalic and atraumatic.  Eyes: EOM are normal. Pupils are equal, round, and reactive to light.  Neck: Normal range of motion.  Cardiovascular: Normal rate and regular rhythm.   No murmur heard. Pulmonary/Chest: Effort normal and breath sounds normal.  Abdominal: Soft. Bowel sounds are normal.  Musculoskeletal: She exhibits no edema.  Neurological: She is alert and oriented to person, place, and time. Coordination normal.  Skin: Skin is warm and dry.  Psychiatric: She has a normal mood and affect.   Filed Vitals:   08/30/14 0943  BP: 136/62  Pulse: 68  Temp: 98.5 F (36.9 C)  TempSrc: Oral  Resp: 16  Height: 5\' 6"  (1.676 m)  Weight: 146 lb 9.6  oz (66.497 kg)  SpO2: 98%      Assessment & Plan:

## 2014-08-30 NOTE — Progress Notes (Signed)
Pre visit review using our clinic review tool, if applicable. No additional management support is needed unless otherwise documented below in the visit note. 

## 2014-08-30 NOTE — Assessment & Plan Note (Signed)
Much improved and rx sent in for celexa 40 mg and called pharmacy to cancel the 20 mg rx. Continue for 6 months and then discuss stopping if improved.

## 2014-08-30 NOTE — Assessment & Plan Note (Signed)
BP controlled back on her medicine. Check BMP, continue lisinopril/hctz.

## 2014-09-04 ENCOUNTER — Encounter: Payer: Self-pay | Admitting: Internal Medicine

## 2014-09-04 NOTE — Progress Notes (Signed)
Quick Note:  Benign mucosal polyps - repeat colon 2026 ______

## 2015-03-01 ENCOUNTER — Ambulatory Visit: Payer: BLUE CROSS/BLUE SHIELD | Admitting: Internal Medicine

## 2015-03-08 ENCOUNTER — Ambulatory Visit (INDEPENDENT_AMBULATORY_CARE_PROVIDER_SITE_OTHER): Payer: BLUE CROSS/BLUE SHIELD | Admitting: Internal Medicine

## 2015-03-08 ENCOUNTER — Other Ambulatory Visit (INDEPENDENT_AMBULATORY_CARE_PROVIDER_SITE_OTHER): Payer: BLUE CROSS/BLUE SHIELD

## 2015-03-08 ENCOUNTER — Encounter: Payer: Self-pay | Admitting: Internal Medicine

## 2015-03-08 VITALS — BP 152/78 | HR 72 | Wt 152.0 lb

## 2015-03-08 DIAGNOSIS — F329 Major depressive disorder, single episode, unspecified: Secondary | ICD-10-CM

## 2015-03-08 DIAGNOSIS — E785 Hyperlipidemia, unspecified: Secondary | ICD-10-CM | POA: Diagnosis not present

## 2015-03-08 DIAGNOSIS — Z0000 Encounter for general adult medical examination without abnormal findings: Secondary | ICD-10-CM

## 2015-03-08 DIAGNOSIS — Z720 Tobacco use: Secondary | ICD-10-CM

## 2015-03-08 DIAGNOSIS — I10 Essential (primary) hypertension: Secondary | ICD-10-CM | POA: Diagnosis not present

## 2015-03-08 DIAGNOSIS — F32A Depression, unspecified: Secondary | ICD-10-CM

## 2015-03-08 LAB — COMPREHENSIVE METABOLIC PANEL
ALT: 34 U/L (ref 0–35)
AST: 32 U/L (ref 0–37)
Albumin: 4 g/dL (ref 3.5–5.2)
Alkaline Phosphatase: 93 U/L (ref 39–117)
BUN: 16 mg/dL (ref 6–23)
CALCIUM: 9.6 mg/dL (ref 8.4–10.5)
CHLORIDE: 104 meq/L (ref 96–112)
CO2: 29 meq/L (ref 19–32)
CREATININE: 0.75 mg/dL (ref 0.40–1.20)
GFR: 99.48 mL/min (ref >60.00)
Glucose, Bld: 76 mg/dL (ref 70–99)
Potassium: 4.3 mEq/L (ref 3.5–5.1)
Sodium: 139 mEq/L (ref 135–145)
Total Bilirubin: 0.7 mg/dL (ref 0.2–1.2)
Total Protein: 8.7 g/dL — ABNORMAL HIGH (ref 6.0–8.3)

## 2015-03-08 LAB — LIPID PANEL
CHOL/HDL RATIO: 3
Cholesterol: 161 mg/dL (ref 0–200)
HDL: 50.7 mg/dL (ref >39.00)
LDL CALC: 99 mg/dL (ref 0–99)
NonHDL: 110.25
TRIGLYCERIDES: 57 mg/dL (ref 0.0–149.0)
VLDL: 11.4 mg/dL (ref 0.0–40.0)

## 2015-03-08 LAB — CBC
HCT: 39.7 % (ref 36.0–46.0)
Hemoglobin: 13 g/dL (ref 12.0–15.0)
MCHC: 32.8 g/dL (ref 30.0–36.0)
MCV: 100.6 fl — AB (ref 78.0–100.0)
Platelets: 233 10*3/uL (ref 150.0–400.0)
RBC: 3.94 Mil/uL (ref 3.87–5.11)
RDW: 14.1 % (ref 11.5–15.5)
WBC: 7.3 10*3/uL (ref 4.0–10.5)

## 2015-03-08 NOTE — Patient Instructions (Signed)
We will check the labs today and call you back with the results.   We are not changing the medicines today.   Smoking Cessation, Tips for Success If you are ready to quit smoking, congratulations! You have chosen to help yourself be healthier. Cigarettes bring nicotine, tar, carbon monoxide, and other irritants into your body. Your lungs, heart, and blood vessels will be able to work better without these poisons. There are many different ways to quit smoking. Nicotine gum, nicotine patches, a nicotine inhaler, or nicotine nasal spray can help with physical craving. Hypnosis, support groups, and medicines help break the habit of smoking. WHAT THINGS CAN I DO TO MAKE QUITTING EASIER?  Here are some tips to help you quit for good:  Pick a date when you will quit smoking completely. Tell all of your friends and family about your plan to quit on that date.  Do not try to slowly cut down on the number of cigarettes you are smoking. Pick a quit date and quit smoking completely starting on that day.  Throw away all cigarettes.   Clean and remove all ashtrays from your home, work, and car.  On a card, write down your reasons for quitting. Carry the card with you and read it when you get the urge to smoke.  Cleanse your body of nicotine. Drink enough water and fluids to keep your urine clear or pale yellow. Do this after quitting to flush the nicotine from your body.  Learn to predict your moods. Do not let a bad situation be your excuse to have a cigarette. Some situations in your life might tempt you into wanting a cigarette.  Never have "just one" cigarette. It leads to wanting another and another. Remind yourself of your decision to quit.  Change habits associated with smoking. If you smoked while driving or when feeling stressed, try other activities to replace smoking. Stand up when drinking your coffee. Brush your teeth after eating. Sit in a different chair when you read the paper. Avoid  alcohol while trying to quit, and try to drink fewer caffeinated beverages. Alcohol and caffeine may urge you to smoke.  Avoid foods and drinks that can trigger a desire to smoke, such as sugary or spicy foods and alcohol.  Ask people who smoke not to smoke around you.  Have something planned to do right after eating or having a cup of coffee. For example, plan to take a walk or exercise.  Try a relaxation exercise to calm you down and decrease your stress. Remember, you may be tense and nervous for the first 2 weeks after you quit, but this will pass.  Find new activities to keep your hands busy. Play with a pen, coin, or rubber band. Doodle or draw things on paper.  Brush your teeth right after eating. This will help cut down on the craving for the taste of tobacco after meals. You can also try mouthwash.   Use oral substitutes in place of cigarettes. Try using lemon drops, carrots, cinnamon sticks, or chewing gum. Keep them handy so they are available when you have the urge to smoke.  When you have the urge to smoke, try deep breathing.  Designate your home as a nonsmoking area.  If you are a heavy smoker, ask your health care provider about a prescription for nicotine chewing gum. It can ease your withdrawal from nicotine.  Reward yourself. Set aside the cigarette money you save and buy yourself something nice.  Look for  support from others. Join a support group or smoking cessation program. Ask someone at home or at work to help you with your plan to quit smoking.  Always ask yourself, "Do I need this cigarette or is this just a reflex?" Tell yourself, "Today, I choose not to smoke," or "I do not want to smoke." You are reminding yourself of your decision to quit.  Do not replace cigarette smoking with electronic cigarettes (commonly called e-cigarettes). The safety of e-cigarettes is unknown, and some may contain harmful chemicals.  If you relapse, do not give up! Plan ahead and  think about what you will do the next time you get the urge to smoke. HOW WILL I FEEL WHEN I QUIT SMOKING? You may have symptoms of withdrawal because your body is used to nicotine (the addictive substance in cigarettes). You may crave cigarettes, be irritable, feel very hungry, cough often, get headaches, or have difficulty concentrating. The withdrawal symptoms are only temporary. They are strongest when you first quit but will go away within 10-14 days. When withdrawal symptoms occur, stay in control. Think about your reasons for quitting. Remind yourself that these are signs that your body is healing and getting used to being without cigarettes. Remember that withdrawal symptoms are easier to treat than the major diseases that smoking can cause.  Even after the withdrawal is over, expect periodic urges to smoke. However, these cravings are generally short lived and will go away whether you smoke or not. Do not smoke! WHAT RESOURCES ARE AVAILABLE TO HELP ME QUIT SMOKING? Your health care provider can direct you to community resources or hospitals for support, which may include:  Group support.  Education.  Hypnosis.  Therapy.   This information is not intended to replace advice given to you by your health care provider. Make sure you discuss any questions you have with your health care provider.   Document Released: 11/23/2003 Document Revised: 03/17/2014 Document Reviewed: 08/12/2012 Elsevier Interactive Patient Education [employer].

## 2015-03-08 NOTE — Progress Notes (Signed)
Pre visit review using our clinic review tool, if applicable. No additional management support is needed unless otherwise documented below in the visit note. 

## 2015-03-09 DIAGNOSIS — J410 Simple chronic bronchitis: Secondary | ICD-10-CM | POA: Insufficient documentation

## 2015-03-09 LAB — HEPATITIS C ANTIBODY: HCV AB: NEGATIVE

## 2015-03-09 NOTE — Progress Notes (Signed)
   Subjective:    Patient ID: Kendra Jordan, female    DOB: October 27, 1950, 64 y.o.   MRN: [id number]  HPI The patient is a 64 YO female coming in for follow up on her blood pressure. She has been taking her medicines as prescribed without side effects. Denies headaches, chest pains, SOB. No new symptoms. Her depression is also doing fine and she has cut herself back to 20 mg of her celexa as it does well and was giving her dry mouth.   Review of Systems  Constitutional: Negative for fever, activity change, appetite change, fatigue and unexpected weight change.  Eyes: Negative.   Respiratory: Negative for cough, chest tightness, shortness of breath and wheezing.   Cardiovascular: Negative for chest pain, palpitations and leg swelling.  Gastrointestinal: Negative for abdominal pain, diarrhea, constipation and abdominal distention.  Musculoskeletal: Negative.   Skin: Negative.   Neurological: Negative for dizziness, syncope, weakness, light-headedness, numbness and headaches.  Psychiatric/Behavioral: Negative.       Objective:   Physical Exam  Constitutional: She is oriented to person, place, and time. She appears well-developed and well-nourished.  HENT:  Head: Normocephalic and atraumatic.  Eyes: EOM are normal. Pupils are equal, round, and reactive to light.  Neck: Normal range of motion.  Cardiovascular: Normal rate and regular rhythm.   Pulmonary/Chest: Effort normal and breath sounds normal.  Abdominal: Soft. Bowel sounds are normal.  Musculoskeletal: She exhibits no edema.  Neurological: She is alert and oriented to person, place, and time. Coordination normal.  Skin: Skin is warm and dry.  Psychiatric: She has a normal mood and affect.   Filed Vitals:   03/08/15 1328 03/08/15 1401  BP: 180/78 152/78  Pulse: 72   Weight: 152 lb (68.947 kg)   SpO2: 95%       Assessment & Plan:

## 2015-03-09 NOTE — Assessment & Plan Note (Signed)
Counseled her about smoking cessation but she declines at this time. She is working on cutting back some and may try to quit in time. Reminded about the risks associated with smoking.

## 2015-03-09 NOTE — Assessment & Plan Note (Signed)
On lipitor 20 mg daily and checking lipid panel. No side effects.

## 2015-03-09 NOTE — Assessment & Plan Note (Signed)
Taking celexa 20 mg daily to balance control and side effects. She was having dry mouth from the higher dose. Doing well now.

## 2015-03-09 NOTE — Assessment & Plan Note (Signed)
BP mildly elevated today due to not taking her medicine this morning. (she did fast for labs) Previous all at goal so will monitor at home and if elevated call and we can increase her regimen. Checking CMP and adjust as needed.

## 2015-03-13 ENCOUNTER — Telehealth: Payer: Self-pay

## 2015-03-13 NOTE — Telephone Encounter (Signed)
Called patient to give her lab results. Unable to reach patient, left message for her to call us back.

## 2015-03-16 ENCOUNTER — Telehealth: Payer: Self-pay | Admitting: Internal Medicine

## 2015-03-16 NOTE — Telephone Encounter (Signed)
Pt called back and I informed her of her lab results.

## 2015-05-03 ENCOUNTER — Other Ambulatory Visit: Payer: Self-pay

## 2015-05-03 MED ORDER — CITALOPRAM HYDROBROMIDE 40 MG PO TABS: 40.0000 mg | ORAL_TABLET | Freq: Every day | ORAL | Status: DC

## 2015-06-25 ENCOUNTER — Other Ambulatory Visit: Payer: Self-pay

## 2015-06-25 MED ORDER — ATORVASTATIN CALCIUM 20 MG PO TABS: 20.0000 mg | ORAL_TABLET | Freq: Every day | ORAL | Status: DC

## 2015-06-25 MED ORDER — LISINOPRIL-HYDROCHLOROTHIAZIDE 20-12.5 MG PO TABS: 1.0000 | ORAL_TABLET | Freq: Every day | ORAL | Status: DC

## 2015-12-20 ENCOUNTER — Other Ambulatory Visit: Payer: Self-pay | Admitting: Internal Medicine

## 2015-12-25 ENCOUNTER — Telehealth: Payer: Self-pay | Admitting: Internal Medicine

## 2015-12-25 NOTE — Telephone Encounter (Signed)
Would be happy to discuss at visit since she has not been in for almost 1 year.

## 2015-12-25 NOTE — Telephone Encounter (Signed)
Patient would like to know if Dr. Sharlet Salina needs to change her current BP medication b/c she has dry cough.  Patient currently takes lisinopril.

## 2015-12-25 NOTE — Telephone Encounter (Signed)
Left patient vm to call back to schedule appt  °

## 2016-01-02 ENCOUNTER — Ambulatory Visit (INDEPENDENT_AMBULATORY_CARE_PROVIDER_SITE_OTHER)
Admission: RE | Admit: 2016-01-02 | Discharge: 2016-01-02 | Disposition: A | Discharge status: Home / Self Care | Payer: BLUE CROSS/BLUE SHIELD | Source: Ambulatory Visit | Attending: Internal Medicine | Admitting: Internal Medicine

## 2016-01-02 ENCOUNTER — Ambulatory Visit (INDEPENDENT_AMBULATORY_CARE_PROVIDER_SITE_OTHER): Payer: BLUE CROSS/BLUE SHIELD | Admitting: Internal Medicine

## 2016-01-02 ENCOUNTER — Encounter: Payer: Self-pay | Admitting: Internal Medicine

## 2016-01-02 VITALS — BP 170/98 | HR 70 | Temp 98.1°F | Resp 14 | Ht 66.0 in | Wt 157.1 lb

## 2016-01-02 DIAGNOSIS — R059 Cough, unspecified: Secondary | ICD-10-CM | POA: Insufficient documentation

## 2016-01-02 DIAGNOSIS — R05 Cough: Secondary | ICD-10-CM | POA: Diagnosis not present

## 2016-01-02 DIAGNOSIS — Z720 Tobacco use: Secondary | ICD-10-CM

## 2016-01-02 MED ORDER — LOSARTAN POTASSIUM-HCTZ 50-12.5 MG PO TABS: 1.0000 | ORAL_TABLET | Freq: Every day | ORAL | 3 refills | Status: DC

## 2016-01-02 MED ORDER — ATORVASTATIN CALCIUM 20 MG PO TABS: 20.0000 mg | ORAL_TABLET | Freq: Every day | ORAL | 3 refills | Status: DC

## 2016-01-02 NOTE — Patient Instructions (Signed)
We have sent in losartan/hctz to replace your blood pressure medicine. Take 1 pill daily.   It can take 3 months for the cough to start if it is causing the cough.   We are checking a chest x-ray today and will call you back about the results.

## 2016-01-02 NOTE — Progress Notes (Signed)
Pre visit review using our clinic review tool, if applicable. No additional management support is needed unless otherwise documented below in the visit note. 

## 2016-01-02 NOTE — Assessment & Plan Note (Signed)
Will change lisinopril to losartan in case this is causative. I informed her that this typical is without sputum. Could be COPD related to her smoking and checking CXR today.

## 2016-01-02 NOTE — Progress Notes (Signed)
   Subjective:    Patient ID: Kendra Jordan, female    DOB: 05/03/1950, 65 y.o.   MRN: [id number]  HPI The patient is a 65 YO female coming in for cough for about 5 months. She is a current smoker and taking lisinopril. She is coughing up some sputum. Denies SOB or chest pains. No weight change recently. Smoking the same amount 1 PPD. Denies allergy symptoms or heartburn problems. She has been out of her meds for 2 days and did not want to fill if they would change.   Review of Systems  Constitutional: Negative for activity change, appetite change, chills, fever and unexpected weight change.  HENT: Negative.   Eyes: Negative.   Respiratory: Positive for cough. Negative for chest tightness, shortness of breath and wheezing.   Cardiovascular: Negative.   Gastrointestinal: Negative.   Musculoskeletal: Negative.       Objective:   Physical Exam  Constitutional: She is oriented to person, place, and time. She appears well-developed and well-nourished.  HENT:  Head: Normocephalic and atraumatic.  Right Ear: External ear normal.  Left Ear: External ear normal.  Mouth/Throat: Oropharynx is clear and moist.  Eyes: EOM are normal.  Neck: Normal range of motion.  Cardiovascular: Normal rate and regular rhythm.   Pulmonary/Chest: Effort normal and breath sounds normal. No respiratory distress. She has no wheezes. She has no rales.  Abdominal: Soft. She exhibits no distension. There is no tenderness.  Neurological: She is alert and oriented to person, place, and time.  Skin: Skin is warm and dry.   Vitals:   01/02/16 1028 01/02/16 1051  BP: (!) 170/60 (!) 170/98  Pulse: 70   Resp: 14   Temp: 98.1 F (36.7 C)   TempSrc: Oral   SpO2: 97%   Weight: 157 lb 1.9 oz (71.3 kg)   Height: 5\' 6"  (1.676 m)       Assessment & Plan:

## 2016-01-02 NOTE — Assessment & Plan Note (Signed)
Talked to her about the risks and harms of smoking and the need to quit. She is not willing to make an attempt at this time.

## 2016-01-22 ENCOUNTER — Telehealth: Payer: Self-pay | Admitting: Internal Medicine

## 2016-01-22 NOTE — Telephone Encounter (Signed)
Left message for patient to call the office to schedule a nurse visit for a blood pressure check and to bring her blood pressure machine in with her to check for accuracy.

## 2016-01-22 NOTE — Telephone Encounter (Signed)
Received patient log of BP in the mail, most are elevated higher than they should. Please call patient to see if she can bring in her meter to check for accuracy at nurse visit for BP. If accurate she should likely have dose adjustment of her blood pressure medication.

## 2016-05-28 ENCOUNTER — Other Ambulatory Visit: Payer: Self-pay | Admitting: Internal Medicine

## 2016-06-23 ENCOUNTER — Other Ambulatory Visit: Payer: Self-pay | Admitting: Internal Medicine

## 2016-06-30 ENCOUNTER — Other Ambulatory Visit: Payer: Self-pay | Admitting: Internal Medicine

## 2016-07-22 ENCOUNTER — Ambulatory Visit (INDEPENDENT_AMBULATORY_CARE_PROVIDER_SITE_OTHER): Payer: BLUE CROSS/BLUE SHIELD | Admitting: Internal Medicine

## 2016-07-22 ENCOUNTER — Other Ambulatory Visit: Payer: Self-pay | Admitting: Internal Medicine

## 2016-07-22 ENCOUNTER — Other Ambulatory Visit (INDEPENDENT_AMBULATORY_CARE_PROVIDER_SITE_OTHER): Payer: BLUE CROSS/BLUE SHIELD

## 2016-07-22 ENCOUNTER — Encounter: Payer: Self-pay | Admitting: Internal Medicine

## 2016-07-22 VITALS — BP 140/72 | HR 69 | Temp 98.7°F | Resp 12 | Ht 66.0 in | Wt 158.0 lb

## 2016-07-22 DIAGNOSIS — E785 Hyperlipidemia, unspecified: Secondary | ICD-10-CM

## 2016-07-22 DIAGNOSIS — I10 Essential (primary) hypertension: Secondary | ICD-10-CM | POA: Diagnosis not present

## 2016-07-22 DIAGNOSIS — F325 Major depressive disorder, single episode, in full remission: Secondary | ICD-10-CM | POA: Diagnosis not present

## 2016-07-22 DIAGNOSIS — Z0000 Encounter for general adult medical examination without abnormal findings: Secondary | ICD-10-CM

## 2016-07-22 DIAGNOSIS — Z23 Encounter for immunization: Secondary | ICD-10-CM

## 2016-07-22 DIAGNOSIS — Z720 Tobacco use: Secondary | ICD-10-CM

## 2016-07-22 DIAGNOSIS — Z1231 Encounter for screening mammogram for malignant neoplasm of breast: Secondary | ICD-10-CM

## 2016-07-22 LAB — LIPID PANEL
Cholesterol: 134 mg/dL (ref 0–200)
HDL: 46.5 mg/dL (ref >39.00)
LDL Cholesterol: 73 mg/dL (ref 0–99)
NONHDL: 87.77
Total CHOL/HDL Ratio: 3
Triglycerides: 72 mg/dL (ref 0.0–149.0)
VLDL: 14.4 mg/dL (ref 0.0–40.0)

## 2016-07-22 LAB — COMPREHENSIVE METABOLIC PANEL
ALK PHOS: 78 U/L (ref 39–117)
ALT: 25 U/L (ref 0–35)
AST: 26 U/L (ref 0–37)
Albumin: 4.1 g/dL (ref 3.5–5.2)
BILIRUBIN TOTAL: 0.4 mg/dL (ref 0.2–1.2)
BUN: 22 mg/dL (ref 6–23)
CO2: 28 meq/L (ref 19–32)
CREATININE: 0.97 mg/dL (ref 0.40–1.20)
Calcium: 9.5 mg/dL (ref 8.4–10.5)
Chloride: 106 mEq/L (ref 96–112)
GFR: 73.62 mL/min (ref >60.00)
GLUCOSE: 83 mg/dL (ref 70–99)
Potassium: 4.1 mEq/L (ref 3.5–5.1)
SODIUM: 140 meq/L (ref 135–145)
TOTAL PROTEIN: 8.6 g/dL — AB (ref 6.0–8.3)

## 2016-07-22 LAB — CBC
HCT: 39.4 % (ref 36.0–46.0)
Hemoglobin: 13.2 g/dL (ref 12.0–15.0)
MCHC: 33.5 g/dL (ref 30.0–36.0)
MCV: 100.8 fl — AB (ref 78.0–100.0)
Platelets: 212 10*3/uL (ref 150.0–400.0)
RBC: 3.91 Mil/uL (ref 3.87–5.11)
RDW: 13.7 % (ref 11.5–15.5)
WBC: 7.2 10*3/uL (ref 4.0–10.5)

## 2016-07-22 LAB — HEMOGLOBIN A1C: HEMOGLOBIN A1C: 5.7 % (ref 4.6–6.5)

## 2016-07-22 MED ORDER — CITALOPRAM HYDROBROMIDE 40 MG PO TABS: 40.0000 mg | ORAL_TABLET | Freq: Every day | ORAL | 3 refills | Status: DC

## 2016-07-22 NOTE — Assessment & Plan Note (Signed)
BP at goal on her losartan/hctz. Checking CMP and adjust as needed.

## 2016-07-22 NOTE — Assessment & Plan Note (Signed)
Out of celexa for about 2 weeks now and would like to resume as she has noticed a negative difference being off. Refilled today.

## 2016-07-22 NOTE — Progress Notes (Signed)
   Subjective:    Patient ID: Kendra Jordan, female    DOB: [birth year], 66 y.o.   MRN: [id number]  HPI The patient is a 66 YO female coming in for wellness. No new concerns. Still smoking  PMH, [facility], social history reviewed and updated.   Review of Systems  Constitutional: Negative.   HENT: Negative.   Eyes: Negative.   Respiratory: Negative for cough, chest tightness and shortness of breath.   Cardiovascular: Negative for chest pain, palpitations and leg swelling.  Gastrointestinal: Negative for abdominal distention, abdominal pain, constipation, diarrhea, nausea and vomiting.  Musculoskeletal: Negative.   Skin: Negative.   Neurological: Negative.   Psychiatric/Behavioral: Negative.       Objective:   Physical Exam  Constitutional: She is oriented to person, place, and time. She appears well-developed and well-nourished.  HENT:  Head: Normocephalic and atraumatic.  Eyes: EOM are normal.  Neck: Normal range of motion.  Cardiovascular: Normal rate and regular rhythm.   Pulmonary/Chest: Effort normal and breath sounds normal. No respiratory distress. She has no wheezes. She has no rales.  Abdominal: Soft. Bowel sounds are normal. She exhibits no distension. There is no tenderness. There is no rebound.  Musculoskeletal: She exhibits no edema.  Neurological: She is alert and oriented to person, place, and time. Coordination normal.  Skin: Skin is warm and dry.  Psychiatric: She has a normal mood and affect.   Vitals:   07/22/16 0855  BP: 140/72  Pulse: 69  Resp: 12  Temp: 98.7 F (37.1 C)  TempSrc: Oral  SpO2: 100%  Weight: 158 lb (71.7 kg)  Height: 5\' 6"  (1.676 m)      Assessment & Plan:  Prevnar 13 given at visit

## 2016-07-22 NOTE — Assessment & Plan Note (Signed)
Still smoking about 1 PPD and no intention to quit at this time. Reminded about the risks and harms of cigarette smoke.

## 2016-07-22 NOTE — Patient Instructions (Signed)
We have sent in the refills and given you the pneumonia shot. Once you get the second one next year you never need another again.   Think about quitting smoking as this is one of the best things you can do for your health.  Health Maintenance, Female Adopting a healthy lifestyle and getting preventive care can go a long way to promote health and wellness. Talk with your health care provider about what schedule of regular examinations is right for you. This is a good chance for you to check in with your provider about disease prevention and staying healthy. In between checkups, there are plenty of things you can do on your own. Experts have done a lot of research about which lifestyle changes and preventive measures are most likely to keep you healthy. Ask your health care provider for more information. Weight and diet Eat a healthy diet  Be sure to include plenty of vegetables, fruits, low-fat dairy products, and lean protein.  Do not eat a lot of foods high in solid fats, added sugars, or salt.  Get regular exercise. This is one of the most important things you can do for your health.  Most adults should exercise for at least 150 minutes each week. The exercise should increase your heart rate and make you sweat (moderate-intensity exercise).  Most adults should also do strengthening exercises at least twice a week. This is in addition to the moderate-intensity exercise. Maintain a healthy weight  Body mass index (BMI) is a measurement that can be used to identify possible weight problems. It estimates body fat based on height and weight. Your health care provider can help determine your BMI and help you achieve or maintain a healthy weight.  For females 78 years of age and older:  A BMI below 18.5 is considered underweight.  A BMI of 18.5 to 24.9 is normal.  A BMI of 25 to 29.9 is considered overweight.  A BMI of 30 and above is considered obese. Watch levels of cholesterol and blood  lipids  You should start having your blood tested for lipids and cholesterol at 67 years of age, then have this test every 5 years.  You may need to have your cholesterol levels checked more often if:  Your lipid or cholesterol levels are high.  You are older than 67 years of age.  You are at high risk for heart disease. Cancer screening Lung Cancer  Lung cancer screening is recommended for adults 44-42 years old who are at high risk for lung cancer because of a history of smoking.  A yearly low-dose CT scan of the lungs is recommended for people who:  Currently smoke.  Have quit within the past 15 years.  Have at least a 30-pack-year history of smoking. A pack year is smoking an average of one pack of cigarettes a day for 1 year.  Yearly screening should continue until it has been 15 years since you quit.  Yearly screening should stop if you develop a health problem that would prevent you from having lung cancer treatment. Breast Cancer  Practice breast self-awareness. This means understanding how your breasts normally appear and feel.  It also means doing regular breast self-exams. Let your health care provider know about any changes, no matter how small.  If you are in your 20s or 30s, you should have a clinical breast exam (CBE) by a health care provider every 1-3 years as part of a regular health exam.  If you are 40  or older, have a CBE every year. Also consider having a breast X-ray (mammogram) every year.  If you have a family history of breast cancer, talk to your health care provider about genetic screening.  If you are at high risk for breast cancer, talk to your health care provider about having an MRI and a mammogram every year.  Breast cancer gene (BRCA) assessment is recommended for women who have family members with BRCA-related cancers. BRCA-related cancers include:  Breast.  Ovarian.  Tubal.  Peritoneal cancers.  Results of the assessment will  determine the need for genetic counseling and BRCA1 and BRCA2 testing. Cervical Cancer  Your health care provider may recommend that you be screened regularly for cancer of the pelvic organs (ovaries, uterus, and vagina). This screening involves a pelvic examination, including checking for microscopic changes to the surface of your cervix (Pap test). You may be encouraged to have this screening done every 3 years, beginning at age 41.  For women ages 41-65, health care providers may recommend pelvic exams and Pap testing every 3 years, or they may recommend the Pap and pelvic exam, combined with testing for human papilloma virus (HPV), every 5 years. Some types of HPV increase your risk of cervical cancer. Testing for HPV may also be done on women of any age with unclear Pap test results.  Other health care providers may not recommend any screening for nonpregnant women who are considered low risk for pelvic cancer and who do not have symptoms. Ask your health care provider if a screening pelvic exam is right for you.  If you have had past treatment for cervical cancer or a condition that could lead to cancer, you need Pap tests and screening for cancer for at least 20 years after your treatment. If Pap tests have been discontinued, your risk factors (such as having a new sexual partner) need to be reassessed to determine if screening should resume. Some women have medical problems that increase the chance of getting cervical cancer. In these cases, your health care provider may recommend more frequent screening and Pap tests. Colorectal Cancer  This type of cancer can be detected and often prevented.  Routine colorectal cancer screening usually begins at 67 years of age and continues through 67 years of age.  Your health care provider may recommend screening at an earlier age if you have risk factors for colon cancer.  Your health care provider may also recommend using home test kits to check for  hidden blood in the stool.  A small camera at the end of a tube can be used to examine your colon directly (sigmoidoscopy or colonoscopy). This is done to check for the earliest forms of colorectal cancer.  Routine screening usually begins at age 33.  Direct examination of the colon should be repeated every 5-10 years through 67 years of age. However, you may need to be screened more often if early forms of precancerous polyps or small growths are found. Skin Cancer  Check your skin from head to toe regularly.  Tell your health care provider about any new moles or changes in moles, especially if there is a change in a mole's shape or color.  Also tell your health care provider if you have a mole that is larger than the size of a pencil eraser.  Always use sunscreen. Apply sunscreen liberally and repeatedly throughout the day.  Protect yourself by wearing long sleeves, pants, a wide-brimmed hat, and sunglasses whenever you are outside. Heart  disease, diabetes, and high blood pressure  High blood pressure causes heart disease and increases the risk of stroke. High blood pressure is more likely to develop in:  People who have blood pressure in the high end of the normal range (130-139/85-89 mm Hg).  People who are overweight or obese.  People who are African American.  If you are 52-58 years of age, have your blood pressure checked every 3-5 years. If you are 83 years of age or older, have your blood pressure checked every year. You should have your blood pressure measured twice-once when you are at a hospital or clinic, and once when you are not at a hospital or clinic. Record the average of the two measurements. To check your blood pressure when you are not at a hospital or clinic, you can use:  An automated blood pressure machine at a pharmacy.  A home blood pressure monitor.  If you are between 6 years and 76 years old, ask your health care provider if you should take aspirin to  prevent strokes.  Have regular diabetes screenings. This involves taking a blood sample to check your fasting blood sugar level.  If you are at a normal weight and have a low risk for diabetes, have this test once every three years after 67 years of age.  If you are overweight and have a high risk for diabetes, consider being tested at a younger age or more often. Preventing infection Hepatitis B  If you have a higher risk for hepatitis B, you should be screened for this virus. You are considered at high risk for hepatitis B if:  You were born in a country where hepatitis B is common. Ask your health care provider which countries are considered high risk.  Your parents were born in a high-risk country, and you have not been immunized against hepatitis B (hepatitis B vaccine).  You have HIV or AIDS.  You use needles to inject street drugs.  You live with someone who has hepatitis B.  You have had sex with someone who has hepatitis B.  You get hemodialysis treatment.  You take certain medicines for conditions, including cancer, organ transplantation, and autoimmune conditions. Hepatitis C  Blood testing is recommended for:  Everyone born from [birth year] through 1966.  Anyone with known risk factors for hepatitis C. Sexually transmitted infections (STIs)  You should be screened for sexually transmitted infections (STIs) including gonorrhea and chlamydia if:  You are sexually active and are younger than 67 years of age.  You are older than 67 years of age and your health care provider tells you that you are at risk for this type of infection.  Your sexual activity has changed since you were last screened and you are at an increased risk for chlamydia or gonorrhea. Ask your health care provider if you are at risk.  If you do not have HIV, but are at risk, it may be recommended that you take a prescription medicine daily to prevent HIV infection. This is called pre-exposure  prophylaxis (PrEP). You are considered at risk if:  You are sexually active and do not regularly use condoms or know the HIV status of your partner(s).  You take drugs by injection.  You are sexually active with a partner who has HIV. Talk with your health care provider about whether you are at high risk of being infected with HIV. If you choose to begin PrEP, you should first be tested for HIV. You should then be  tested every 3 months for as long as you are taking PrEP. Pregnancy  If you are premenopausal and you may become pregnant, ask your health care provider about preconception counseling.  If you may become pregnant, take 400 to 800 micrograms (mcg) of folic acid every day.  If you want to prevent pregnancy, talk to your health care provider about birth control (contraception). Osteoporosis and menopause  Osteoporosis is a disease in which the bones lose minerals and strength with aging. This can result in serious bone fractures. Your risk for osteoporosis can be identified using a bone density scan.  If you are 73 years of age or older, or if you are at risk for osteoporosis and fractures, ask your health care provider if you should be screened.  Ask your health care provider whether you should take a calcium or vitamin D supplement to lower your risk for osteoporosis.  Menopause may have certain physical symptoms and risks.  Hormone replacement therapy may reduce some of these symptoms and risks. Talk to your health care provider about whether hormone replacement therapy is right for you. Follow these instructions at home:  Schedule regular health, dental, and eye exams.  Stay current with your immunizations.  Do not use any tobacco products including cigarettes, chewing tobacco, or electronic cigarettes.  If you are pregnant, do not drink alcohol.  If you are breastfeeding, limit how much and how often you drink alcohol.  Limit alcohol intake to no more than 1 drink  per day for nonpregnant women. One drink equals 12 ounces of beer, 5 ounces of wine, or 1 ounces of hard liquor.  Do not use street drugs.  Do not share needles.  Ask your health care provider for help if you need support or information about quitting drugs.  Tell your health care provider if you often feel depressed.  Tell your health care provider if you have ever been abused or do not feel safe at home. This information is not intended to replace advice given to you by your health care provider. Make sure you discuss any questions you have with your health care provider. Document Released: 09/09/2010 Document Revised: 08/02/2015 Document Reviewed: 11/28/2014 Elsevier Interactive Patient Education  2017 [employer].

## 2016-07-22 NOTE — Assessment & Plan Note (Signed)
Taking lipitor 20 mg daily, checking lipid panel and adjust as needed.  °

## 2016-08-01 ENCOUNTER — Ambulatory Visit
Admission: RE | Admit: 2016-08-01 | Discharge: 2016-08-01 | Disposition: A | Discharge status: Home / Self Care | Payer: BLUE CROSS/BLUE SHIELD | Source: Ambulatory Visit | Attending: Internal Medicine | Admitting: Internal Medicine

## 2016-08-01 DIAGNOSIS — Z1231 Encounter for screening mammogram for malignant neoplasm of breast: Secondary | ICD-10-CM

## 2016-12-23 ENCOUNTER — Other Ambulatory Visit: Payer: Self-pay | Admitting: Internal Medicine

## 2017-03-27 ENCOUNTER — Ambulatory Visit: Payer: BLUE CROSS/BLUE SHIELD | Admitting: Internal Medicine

## 2017-03-27 ENCOUNTER — Other Ambulatory Visit (INDEPENDENT_AMBULATORY_CARE_PROVIDER_SITE_OTHER): Payer: BLUE CROSS/BLUE SHIELD

## 2017-03-27 ENCOUNTER — Encounter: Payer: Self-pay | Admitting: Internal Medicine

## 2017-03-27 VITALS — BP 138/70 | HR 62 | Temp 97.9°F | Ht 66.0 in | Wt 159.0 lb

## 2017-03-27 DIAGNOSIS — E785 Hyperlipidemia, unspecified: Secondary | ICD-10-CM

## 2017-03-27 DIAGNOSIS — I10 Essential (primary) hypertension: Secondary | ICD-10-CM

## 2017-03-27 DIAGNOSIS — F325 Major depressive disorder, single episode, in full remission: Secondary | ICD-10-CM | POA: Diagnosis not present

## 2017-03-27 DIAGNOSIS — J410 Simple chronic bronchitis: Secondary | ICD-10-CM

## 2017-03-27 LAB — LIPID PANEL
CHOL/HDL RATIO: 3
Cholesterol: 122 mg/dL (ref 0–200)
HDL: 43.9 mg/dL (ref >39.00)
LDL Cholesterol: 54 mg/dL (ref 0–99)
NONHDL: 78.43
TRIGLYCERIDES: 120 mg/dL (ref 0.0–149.0)
VLDL: 24 mg/dL (ref 0.0–40.0)

## 2017-03-27 LAB — COMPREHENSIVE METABOLIC PANEL
ALT: 19 U/L (ref 0–35)
AST: 23 U/L (ref 0–37)
Albumin: 3.7 g/dL (ref 3.5–5.2)
Alkaline Phosphatase: 70 U/L (ref 39–117)
BILIRUBIN TOTAL: 0.3 mg/dL (ref 0.2–1.2)
BUN: 22 mg/dL (ref 6–23)
CALCIUM: 8.7 mg/dL (ref 8.4–10.5)
CHLORIDE: 105 meq/L (ref 96–112)
CO2: 28 meq/L (ref 19–32)
Creatinine, Ser: 0.89 mg/dL (ref 0.40–1.20)
GFR: 81.15 mL/min (ref >60.00)
GLUCOSE: 83 mg/dL (ref 70–99)
Potassium: 3.7 mEq/L (ref 3.5–5.1)
Sodium: 138 mEq/L (ref 135–145)
Total Protein: 7.5 g/dL (ref 6.0–8.3)

## 2017-03-27 LAB — CBC
HCT: 35 % — ABNORMAL LOW (ref 36.0–46.0)
HEMOGLOBIN: 12 g/dL (ref 12.0–15.0)
MCHC: 34.3 g/dL (ref 30.0–36.0)
MCV: 100.2 fl — AB (ref 78.0–100.0)
PLATELETS: 183 10*3/uL (ref 150.0–400.0)
RBC: 3.49 Mil/uL — ABNORMAL LOW (ref 3.87–5.11)
RDW: 13.9 % (ref 11.5–15.5)
WBC: 7.4 10*3/uL (ref 4.0–10.5)

## 2017-03-27 MED ORDER — LOSARTAN POTASSIUM-HCTZ 50-12.5 MG PO TABS: 1.0000 | ORAL_TABLET | Freq: Every day | ORAL | 3 refills | Status: DC

## 2017-03-27 MED ORDER — CITALOPRAM HYDROBROMIDE 40 MG PO TABS: 40.0000 mg | ORAL_TABLET | Freq: Every day | ORAL | 3 refills | Status: DC

## 2017-03-27 MED ORDER — ATORVASTATIN CALCIUM 20 MG PO TABS: 20.0000 mg | ORAL_TABLET | Freq: Every day | ORAL | 3 refills | Status: DC

## 2017-03-27 NOTE — Assessment & Plan Note (Signed)
Taking losartan/hctz. BP at goal. Checking CMP and adjust as needed.

## 2017-03-27 NOTE — Assessment & Plan Note (Signed)
Taking celexa 40 mg daily and symptoms are controlled. Without medication she does get recurrence and does not want to try stopping therapy at this time. Refilled today.

## 2017-03-27 NOTE — Assessment & Plan Note (Signed)
Time spent counseling about tobacco usage: 3 minutes. I have asked about smoking and is smoking same than usual. The patient is advised to quit. The patient is not willing to quit. They would like to try to quit in the next 12 months. We will follow up with them in 12 months.

## 2017-03-27 NOTE — Patient Instructions (Signed)
We will check the labs today and call you back about the results.    

## 2017-03-27 NOTE — Progress Notes (Signed)
   Subjective:    Patient ID: Kendra Jordan, female    DOB: Sep 23, 1950, 68 y.o.   MRN: [id number]  HPI The patient is a 67 YO female coming in for follow up of her medical problems including hypertension (taking her losartan/hctz and denies side effects, denies headache or chest pains, BP at goal at home) and her depression (still taking celexa, some stress increased at home, denies wanting to come off at this time, denies SI/HI) and her hyperlipidemia (taking lipitor, denies side effects, no new cad or stroke symptoms). Still smoking and does not want to stop at this time.  Review of Systems  Constitutional: Negative.   HENT: Negative.   Eyes: Negative.   Respiratory: Positive for cough. Negative for chest tightness and shortness of breath.   Cardiovascular: Negative for chest pain, palpitations and leg swelling.  Gastrointestinal: Negative for abdominal distention, abdominal pain, constipation, diarrhea, nausea and vomiting.  Musculoskeletal: Negative.   Skin: Negative.   Neurological: Negative.   Psychiatric/Behavioral: Negative.       Objective:   Physical Exam  Constitutional: She is oriented to person, place, and time. She appears well-developed and well-nourished.  HENT:  Head: Normocephalic and atraumatic.  Eyes: EOM are normal.  Neck: Normal range of motion.  Cardiovascular: Normal rate and regular rhythm.  Pulmonary/Chest: Effort normal and breath sounds normal. No respiratory distress. She has no wheezes. She has no rales.  Abdominal: Soft. Bowel sounds are normal. She exhibits no distension. There is no tenderness. There is no rebound.  Musculoskeletal: She exhibits no edema.  Neurological: She is alert and oriented to person, place, and time. Coordination normal.  Skin: Skin is warm and dry.  Psychiatric: She has a normal mood and affect.   Vitals:   03/27/17 0812  BP: 138/70  Pulse: 62  Temp: 97.9 F (36.6 C)  TempSrc: Oral  SpO2: 99%  Weight: 159 lb (72.1 kg)   Height: 5\' 6"  (1.676 m)      Assessment & Plan:

## 2017-03-27 NOTE — Assessment & Plan Note (Signed)
Checking lipid panel and adjust lipitor 20 mg daily as needed. 

## 2017-12-29 NOTE — Telephone Encounter (Signed)
error 

## 2018-03-15 ENCOUNTER — Other Ambulatory Visit: Payer: Self-pay | Admitting: Internal Medicine

## 2018-04-16 ENCOUNTER — Ambulatory Visit: Payer: BLUE CROSS/BLUE SHIELD | Admitting: Internal Medicine

## 2018-04-16 ENCOUNTER — Encounter: Payer: Self-pay | Admitting: Internal Medicine

## 2018-04-16 ENCOUNTER — Other Ambulatory Visit (INDEPENDENT_AMBULATORY_CARE_PROVIDER_SITE_OTHER): Payer: BLUE CROSS/BLUE SHIELD

## 2018-04-16 VITALS — BP 160/70 | HR 78 | Temp 98.4°F | Ht 66.0 in | Wt 148.0 lb

## 2018-04-16 DIAGNOSIS — Z0000 Encounter for general adult medical examination without abnormal findings: Secondary | ICD-10-CM | POA: Insufficient documentation

## 2018-04-16 DIAGNOSIS — I10 Essential (primary) hypertension: Secondary | ICD-10-CM

## 2018-04-16 DIAGNOSIS — F325 Major depressive disorder, single episode, in full remission: Secondary | ICD-10-CM

## 2018-04-16 DIAGNOSIS — J410 Simple chronic bronchitis: Secondary | ICD-10-CM

## 2018-04-16 DIAGNOSIS — E785 Hyperlipidemia, unspecified: Secondary | ICD-10-CM | POA: Diagnosis not present

## 2018-04-16 LAB — COMPREHENSIVE METABOLIC PANEL
ALT: 13 U/L (ref 0–35)
AST: 19 U/L (ref 0–37)
Albumin: 3.7 g/dL (ref 3.5–5.2)
Alkaline Phosphatase: 78 U/L (ref 39–117)
BILIRUBIN TOTAL: 0.3 mg/dL (ref 0.2–1.2)
BUN: 15 mg/dL (ref 6–23)
CO2: 28 mEq/L (ref 19–32)
Calcium: 9.2 mg/dL (ref 8.4–10.5)
Chloride: 106 mEq/L (ref 96–112)
Creatinine, Ser: 0.73 mg/dL (ref 0.40–1.20)
GFR: 95.67 mL/min (ref >60.00)
Glucose, Bld: 73 mg/dL (ref 70–99)
Potassium: 4.3 mEq/L (ref 3.5–5.1)
Sodium: 139 mEq/L (ref 135–145)
TOTAL PROTEIN: 7.6 g/dL (ref 6.0–8.3)

## 2018-04-16 LAB — CBC
HCT: 36.7 % (ref 36.0–46.0)
Hemoglobin: 12.5 g/dL (ref 12.0–15.0)
MCHC: 34.2 g/dL (ref 30.0–36.0)
MCV: 99.5 fl (ref 78.0–100.0)
Platelets: 203 10*3/uL (ref 150.0–400.0)
RBC: 3.69 Mil/uL — AB (ref 3.87–5.11)
RDW: 14.4 % (ref 11.5–15.5)
WBC: 5.5 10*3/uL (ref 4.0–10.5)

## 2018-04-16 LAB — LIPID PANEL
Cholesterol: 133 mg/dL (ref 0–200)
HDL: 41.1 mg/dL (ref >39.00)
LDL Cholesterol: 71 mg/dL (ref 0–99)
NonHDL: 92.11
Total CHOL/HDL Ratio: 3
Triglycerides: 104 mg/dL (ref 0.0–149.0)
VLDL: 20.8 mg/dL (ref 0.0–40.0)

## 2018-04-16 MED ORDER — ATORVASTATIN CALCIUM 20 MG PO TABS: 20.0000 mg | ORAL_TABLET | Freq: Every day | ORAL | 3 refills | Status: DC

## 2018-04-16 MED ORDER — CITALOPRAM HYDROBROMIDE 40 MG PO TABS: 40.0000 mg | ORAL_TABLET | Freq: Every day | ORAL | 3 refills | Status: DC

## 2018-04-16 MED ORDER — LOSARTAN POTASSIUM-HCTZ 50-12.5 MG PO TABS: 1.0000 | ORAL_TABLET | Freq: Every day | ORAL | 3 refills | Status: DC

## 2018-04-16 NOTE — Assessment & Plan Note (Signed)
The patient has moderate depression which is not recurrent. There are not psychotic features. Prior therapy tried none. Current therapy celexa which is not adjusted today. Return in 1 year. Currently in remission.

## 2018-04-16 NOTE — Assessment & Plan Note (Signed)
BP mildly elevated due to being out of medication. Previous readings on medications are at goal. Refilled losartan/hctz 50/12.5 mg daily and checking CMP. Adjust as needed.

## 2018-04-16 NOTE — Assessment & Plan Note (Signed)
Checking lipid panel and adjust lipitor 20 mg daily.  

## 2018-04-16 NOTE — Progress Notes (Signed)
   Subjective:   Patient ID: Kendra Jordan, female    DOB: 09-07-1950, 68 y.o.   MRN: [id number]  HPI The patient is a 68 YO female coming in for physical. Still working. Out of meds for several days.   PMH, [facility], social history reviewed and updated  Review of Systems  Constitutional: Negative.   HENT: Negative.   Eyes: Negative.   Respiratory: Negative for cough, chest tightness and shortness of breath.   Cardiovascular: Negative for chest pain, palpitations and leg swelling.  Gastrointestinal: Negative for abdominal distention, abdominal pain, constipation, diarrhea, nausea and vomiting.  Musculoskeletal: Negative.   Skin: Negative.   Neurological: Negative.   Psychiatric/Behavioral: Negative.     Objective:  Physical Exam Constitutional:      Appearance: She is well-developed.  HENT:     Head: Normocephalic and atraumatic.  Neck:     Musculoskeletal: Normal range of motion.  Cardiovascular:     Rate and Rhythm: Normal rate and regular rhythm.  Pulmonary:     Effort: Pulmonary effort is normal. No respiratory distress.     Breath sounds: Normal breath sounds. No wheezing or rales.  Abdominal:     General: Bowel sounds are normal. There is no distension.     Palpations: Abdomen is soft.     Tenderness: There is no abdominal tenderness. There is no rebound.  Skin:    General: Skin is warm and dry.  Neurological:     Mental Status: She is alert and oriented to person, place, and time.     Coordination: Coordination normal.     Vitals:   04/16/18 0936 04/16/18 0958  BP: (!) 170/82 (!) 160/70  Pulse: 78   Temp: 98.4 F (36.9 C)   TempSrc: Oral   SpO2: 98%   Weight: 148 lb (67.1 kg)   Height: 5\' 6"  (1.676 m)     Assessment & Plan:

## 2018-04-16 NOTE — Assessment & Plan Note (Signed)
Flu shot declines. Pneumonia declines. Shingrix declines. Tetanus declines. Colonoscopy up to date. Mammogram up to date, pap smear aged out and dexa up to date. Counseled about sun safety and mole surveillance. Counseled about the dangers of distracted driving. Given 10 year screening recommendations.

## 2018-04-16 NOTE — Patient Instructions (Signed)
Think about getting the pneumonia shot to help prevent getting sick.   Health Maintenance, Female Adopting a healthy lifestyle and getting preventive care can go a long way to promote health and wellness. Talk with your health care provider about what schedule of regular examinations is right for you. This is a good chance for you to check in with your provider about disease prevention and staying healthy. In between checkups, there are plenty of things you can do on your own. Experts have done a lot of research about which lifestyle changes and preventive measures are most likely to keep you healthy. Ask your health care provider for more information. Weight and diet Eat a healthy diet  Be sure to include plenty of vegetables, fruits, low-fat dairy products, and lean protein.  Do not eat a lot of foods high in solid fats, added sugars, or salt.  Get regular exercise. This is one of the most important things you can do for your health. ? Most adults should exercise for at least 150 minutes each week. The exercise should increase your heart rate and make you sweat (moderate-intensity exercise). ? Most adults should also do strengthening exercises at least twice a week. This is in addition to the moderate-intensity exercise. Maintain a healthy weight  Body mass index (BMI) is a measurement that can be used to identify possible weight problems. It estimates body fat based on height and weight. Your health care provider can help determine your BMI and help you achieve or maintain a healthy weight.  For females 48 years of age and older: ? A BMI below 18.5 is considered underweight. ? A BMI of 18.5 to 24.9 is normal. ? A BMI of 25 to 29.9 is considered overweight. ? A BMI of 30 and above is considered obese. Watch levels of cholesterol and blood lipids  You should start having your blood tested for lipids and cholesterol at 69 years of age, then have this test every 5 years.  You may need to  have your cholesterol levels checked more often if: ? Your lipid or cholesterol levels are high. ? You are older than 69 years of age. ? You are at high risk for heart disease. Cancer screening Lung Cancer  Lung cancer screening is recommended for adults 24-44 years old who are at high risk for lung cancer because of a history of smoking.  A yearly low-dose CT scan of the lungs is recommended for people who: ? Currently smoke. ? Have quit within the past 15 years. ? Have at least a 30-pack-year history of smoking. A pack year is smoking an average of one pack of cigarettes a day for 1 year.  Yearly screening should continue until it has been 15 years since you quit.  Yearly screening should stop if you develop a health problem that would prevent you from having lung cancer treatment. Breast Cancer  Practice breast self-awareness. This means understanding how your breasts normally appear and feel.  It also means doing regular breast self-exams. Let your health care provider know about any changes, no matter how small.  If you are in your 20s or 30s, you should have a clinical breast exam (CBE) by a health care provider every 1-3 years as part of a regular health exam.  If you are 28 or older, have a CBE every year. Also consider having a breast X-ray (mammogram) every year.  If you have a family history of breast cancer, talk to your health care provider about  genetic screening.  If you are at high risk for breast cancer, talk to your health care provider about having an MRI and a mammogram every year.  Breast cancer gene (BRCA) assessment is recommended for women who have family members with BRCA-related cancers. BRCA-related cancers include: ? Breast. ? Ovarian. ? Tubal. ? Peritoneal cancers.  Results of the assessment will determine the need for genetic counseling and BRCA1 and BRCA2 testing. Cervical Cancer Your health care provider may recommend that you be screened  regularly for cancer of the pelvic organs (ovaries, uterus, and vagina). This screening involves a pelvic examination, including checking for microscopic changes to the surface of your cervix (Pap test). You may be encouraged to have this screening done every 3 years, beginning at age 61.  For women ages 52-65, health care providers may recommend pelvic exams and Pap testing every 3 years, or they may recommend the Pap and pelvic exam, combined with testing for human papilloma virus (HPV), every 5 years. Some types of HPV increase your risk of cervical cancer. Testing for HPV may also be done on women of any age with unclear Pap test results.  Other health care providers may not recommend any screening for nonpregnant women who are considered low risk for pelvic cancer and who do not have symptoms. Ask your health care provider if a screening pelvic exam is right for you.  If you have had past treatment for cervical cancer or a condition that could lead to cancer, you need Pap tests and screening for cancer for at least 20 years after your treatment. If Pap tests have been discontinued, your risk factors (such as having a new sexual partner) need to be reassessed to determine if screening should resume. Some women have medical problems that increase the chance of getting cervical cancer. In these cases, your health care provider may recommend more frequent screening and Pap tests. Colorectal Cancer  This type of cancer can be detected and often prevented.  Routine colorectal cancer screening usually begins at 69 years of age and continues through 69 years of age.  Your health care provider may recommend screening at an earlier age if you have risk factors for colon cancer.  Your health care provider may also recommend using home test kits to check for hidden blood in the stool.  A small camera at the end of a tube can be used to examine your colon directly (sigmoidoscopy or colonoscopy). This is  done to check for the earliest forms of colorectal cancer.  Routine screening usually begins at age 44.  Direct examination of the colon should be repeated every 5-10 years through 69 years of age. However, you may need to be screened more often if early forms of precancerous polyps or small growths are found. Skin Cancer  Check your skin from head to toe regularly.  Tell your health care provider about any new moles or changes in moles, especially if there is a change in a mole's shape or color.  Also tell your health care provider if you have a mole that is larger than the size of a pencil eraser.  Always use sunscreen. Apply sunscreen liberally and repeatedly throughout the day.  Protect yourself by wearing long sleeves, pants, a wide-brimmed hat, and sunglasses whenever you are outside. Heart disease, diabetes, and high blood pressure  High blood pressure causes heart disease and increases the risk of stroke. High blood pressure is more likely to develop in: ? People who have blood pressure  in the high end of the normal range (130-139/85-89 mm Hg). ? People who are overweight or obese. ? People who are African American.  If you are 5-13 years of age, have your blood pressure checked every 3-5 years. If you are 20 years of age or older, have your blood pressure checked every year. You should have your blood pressure measured twice-once when you are at a hospital or clinic, and once when you are not at a hospital or clinic. Record the average of the two measurements. To check your blood pressure when you are not at a hospital or clinic, you can use: ? An automated blood pressure machine at a pharmacy. ? A home blood pressure monitor.  If you are between 79 years and 41 years old, ask your health care provider if you should take aspirin to prevent strokes.  Have regular diabetes screenings. This involves taking a blood sample to check your fasting blood sugar level. ? If you are at a  normal weight and have a low risk for diabetes, have this test once every three years after 69 years of age. ? If you are overweight and have a high risk for diabetes, consider being tested at a younger age or more often. Preventing infection Hepatitis B  If you have a higher risk for hepatitis B, you should be screened for this virus. You are considered at high risk for hepatitis B if: ? You were born in a country where hepatitis B is common. Ask your health care provider which countries are considered high risk. ? Your parents were born in a high-risk country, and you have not been immunized against hepatitis B (hepatitis B vaccine). ? You have HIV or AIDS. ? You use needles to inject street drugs. ? You live with someone who has hepatitis B. ? You have had sex with someone who has hepatitis B. ? You get hemodialysis treatment. ? You take certain medicines for conditions, including cancer, organ transplantation, and autoimmune conditions. Hepatitis C  Blood testing is recommended for: ? Everyone born from [birth year] through 1966. ? Anyone with known risk factors for hepatitis C. Sexually transmitted infections (STIs)  You should be screened for sexually transmitted infections (STIs) including gonorrhea and chlamydia if: ? You are sexually active and are younger than 69 years of age. ? You are older than 69 years of age and your health care provider tells you that you are at risk for this type of infection. ? Your sexual activity has changed since you were last screened and you are at an increased risk for chlamydia or gonorrhea. Ask your health care provider if you are at risk.  If you do not have HIV, but are at risk, it may be recommended that you take a prescription medicine daily to prevent HIV infection. This is called pre-exposure prophylaxis (PrEP). You are considered at risk if: ? You are sexually active and do not regularly use condoms or know the HIV status of your partner(s). ? You  take drugs by injection. ? You are sexually active with a partner who has HIV. Talk with your health care provider about whether you are at high risk of being infected with HIV. If you choose to begin PrEP, you should first be tested for HIV. You should then be tested every 3 months for as long as you are taking PrEP. Pregnancy  If you are premenopausal and you may become pregnant, ask your health care provider about preconception counseling.  If you  may become pregnant, take 400 to 800 micrograms (mcg) of folic acid every day.  If you want to prevent pregnancy, talk to your health care provider about birth control (contraception). Osteoporosis and menopause  Osteoporosis is a disease in which the bones lose minerals and strength with aging. This can result in serious bone fractures. Your risk for osteoporosis can be identified using a bone density scan.  If you are 78 years of age or older, or if you are at risk for osteoporosis and fractures, ask your health care provider if you should be screened.  Ask your health care provider whether you should take a calcium or vitamin D supplement to lower your risk for osteoporosis.  Menopause may have certain physical symptoms and risks.  Hormone replacement therapy may reduce some of these symptoms and risks. Talk to your health care provider about whether hormone replacement therapy is right for you. Follow these instructions at home:  Schedule regular health, dental, and eye exams.  Stay current with your immunizations.  Do not use any tobacco products including cigarettes, chewing tobacco, or electronic cigarettes.  If you are pregnant, do not drink alcohol.  If you are breastfeeding, limit how much and how often you drink alcohol.  Limit alcohol intake to no more than 1 drink per day for nonpregnant women. One drink equals 12 ounces of beer, 5 ounces of wine, or 1 ounces of hard liquor.  Do not use street drugs.  Do not share  needles.  Ask your health care provider for help if you need support or information about quitting drugs.  Tell your health care provider if you often feel depressed.  Tell your health care provider if you have ever been abused or do not feel safe at home. This information is not intended to replace advice given to you by your health care provider. Make sure you discuss any questions you have with your health care provider. Document Released: 09/09/2010 Document Revised: 08/02/2015 Document Reviewed: 11/28/2014 Elsevier Interactive Patient Education  2019 [employer].

## 2018-04-16 NOTE — Assessment & Plan Note (Signed)
Still smoking and does not have interest in quitting at this time. Still with cough chronic but denies SOB.

## 2018-05-01 IMAGING — DX DG CHEST 2V
2 series · 2 of 2 positions shown · non-contrast
Comparison: 11/02/2003

CLINICAL DATA: Productive cough.  Smoker.

EXAM:
CHEST  2 VIEW

[chest pa]
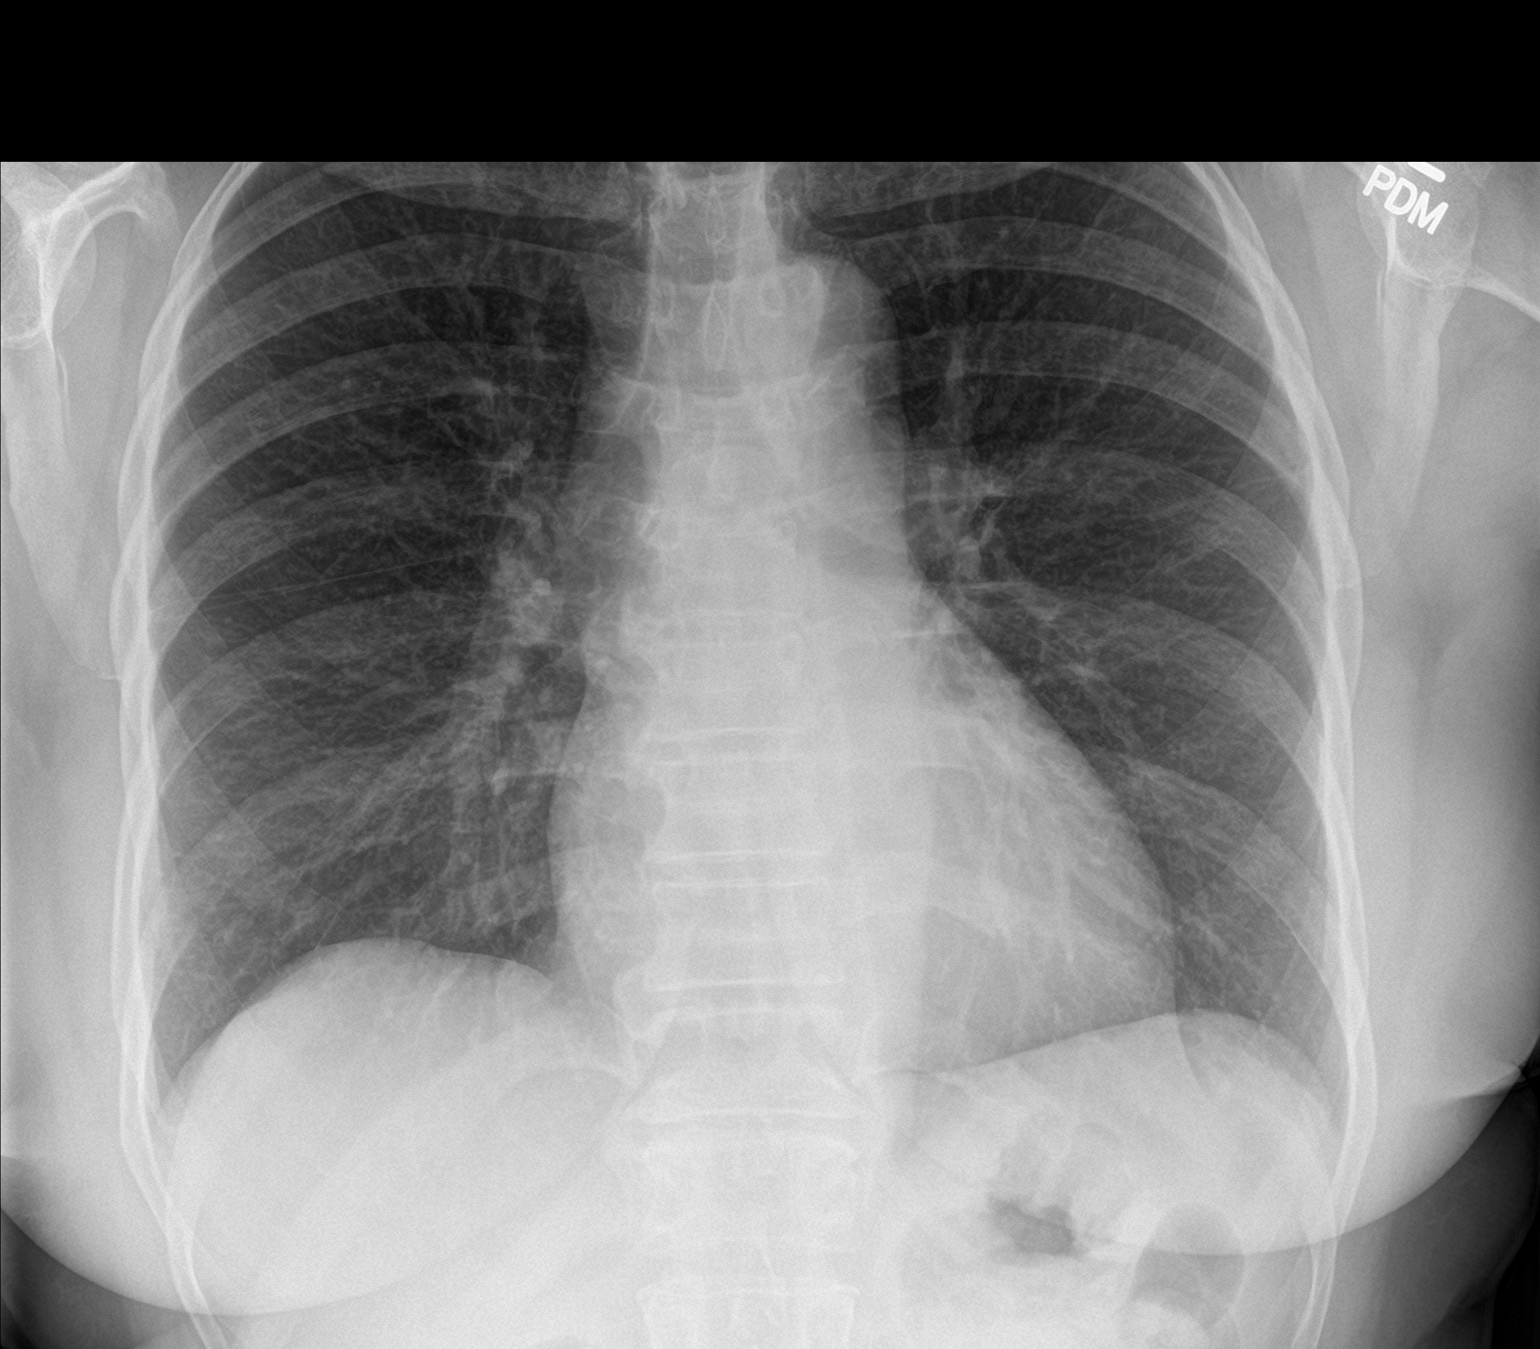

[chest lat]
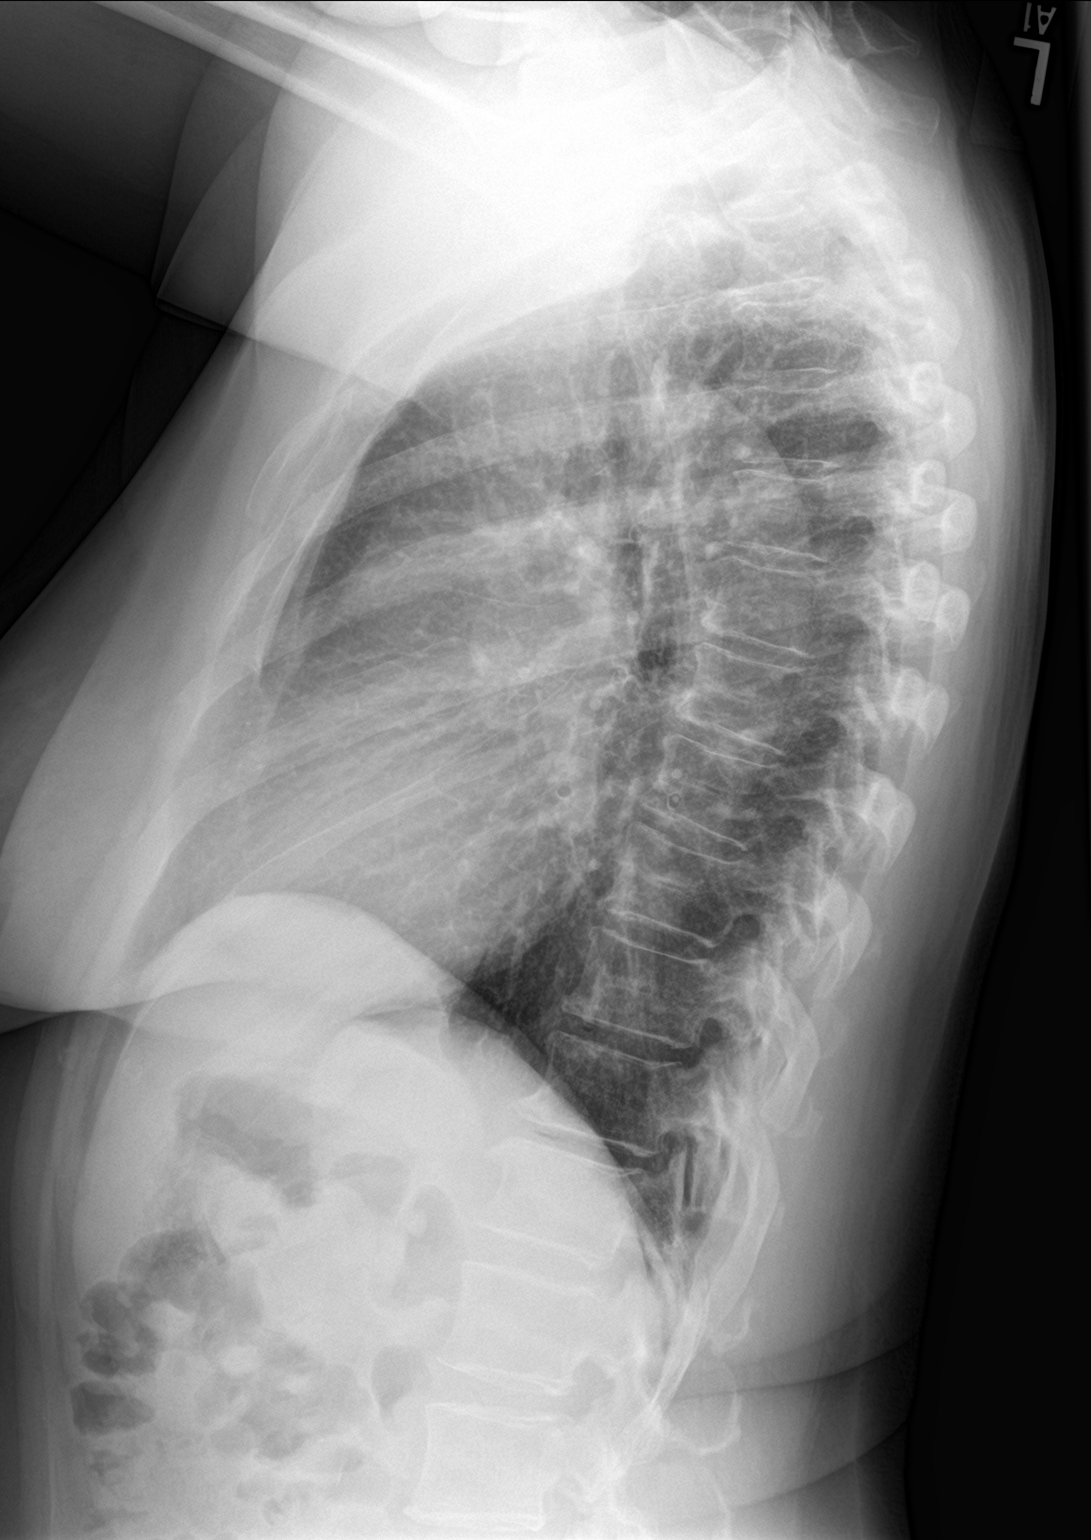

[2 of 2 positions shown; findings below may reference images not displayed]

FINDINGS: The heart size and mediastinal contours are within normal limits.
There is no evidence of pulmonary edema, consolidation,
pneumothorax, nodule or pleural fluid. The visualized skeletal
structures are unremarkable.
IMPRESSION: No active cardiopulmonary disease.

## 2018-06-16 ENCOUNTER — Telehealth: Payer: Self-pay | Admitting: Internal Medicine

## 2018-06-16 NOTE — Telephone Encounter (Signed)
Can patient do a virtual visit with another provider today?

## 2018-06-16 NOTE — Telephone Encounter (Signed)
Pt left vm stating she thinks she is having a reaction to her BP medication. In message pt states she is itching and numbness in her hands, coughing spells, numbness in her legs, increase urgency to go to bathroom, almost is unable to make it to restroom

## 2018-06-16 NOTE — Telephone Encounter (Signed)
Tried to call patient and phone rang and rang and then had a busy signal.

## 2018-06-16 NOTE — Telephone Encounter (Signed)
Patient cannot do virtual.

## 2018-06-17 ENCOUNTER — Ambulatory Visit: Payer: Self-pay

## 2018-06-17 MED ORDER — BENZONATATE 200 MG PO CAPS: 200.0000 mg | ORAL_CAPSULE | Freq: Three times a day (TID) | ORAL | 0 refills | Status: DC | PRN

## 2018-06-17 NOTE — Telephone Encounter (Signed)
Requested by Sam at [facility] to triage pt.  Pt c/o left hand tingling that comes and goes and has been present for 6 months. Pt c/o her left thumb is itchy. Pt stated the tingling was not present at the time of the call. Pt c/o dizziness when she stands up too fast, dry mouth, tingling to right knee. Pt c/o productive cough with white to yellow phlegm. Pt afebrile, no SOB or difficulty breathing.  Pt given care advice and pt verbalized understanding. Pt does not have e-mail.  Called Sam at [place] asked to send triage note to office for Dr Sharlet Salina to review.      Reason for Disposition . [1] Numbness or tingling in one or both hands AND [2] is a chronic symptom (recurrent or ongoing AND present > 4 weeks)  Answer Assessment - Initial Assessment Questions 1. SYMPTOM: "What is the main symptom you are concerned about?" (e.g., weakness, numbness)     Tingling left hand 2. ONSET: "When did this start?" (minutes, hours, days; while sleeping)     6 months ago 3. LAST NORMAL: "When was the last time you were normal (no symptoms)?"     Before 6 months 4. PATTERN "Does this come and go, or has it been constant since it started?"  "Is it present now?"     Comes and goes- not present at time of call 5. CARDIAC SYMPTOMS: "Have you had any of the following symptoms: chest pain, difficulty breathing, palpitations?"     no 6. NEUROLOGIC SYMPTOMS: "Have you had any of the following symptoms: headache, dizziness, vision loss, double vision, changes in speech, unsteady on your feet?"     Dizziness with standing, occasional headache,tingling left knee intermittently then went to left knee 7. OTHER SYMPTOMS: "Do you have any other symptoms?"     Coughing spells white to yellow phlegm  8. PREGNANCY: "Is there any chance you are pregnant?" "When was your last menstrual period?"     n/a  Protocols used: NEUROLOGIC DEFICIT-A-AH

## 2018-06-17 NOTE — Addendum Note (Signed)
Addended by: Pricilla Holm A on: 06/17/2018 02:54 PM   Modules accepted: Orders

## 2018-06-17 NOTE — Telephone Encounter (Signed)
Called patient back and she does not have a smart phone to do a doxy visit. Patient is not having any chest pain, nausea, vomiting, or dark bloody stools. States she does have diarrhea but that is not out of the norm if she eats certain foods.

## 2018-06-17 NOTE — Telephone Encounter (Signed)
Has her cough changed at all recently (she has chronic cough). We have not changed any of her medications recently but certainly medications can cause dry mouth. Is she drinking enough water daily?

## 2018-06-17 NOTE — Telephone Encounter (Signed)
Patient informed of MD response and stated understanding  

## 2018-06-17 NOTE — Telephone Encounter (Signed)
Cough has changed to be deeper, aggressive, and more productive. States she is used to the small cough that she usually has but now she is coughing and having to spit and that can be up to 15 minutes is drinking water and that sometimes will make the cough worse. Patient was also wanting to know if there was anything to explain her on and off left hand numbness that has been going on for 6 months and sometimes the hand is itchy.

## 2018-06-17 NOTE — Telephone Encounter (Signed)
Can we connect her with the triage nurse at [facility] for the numbness symptoms. We need more information about her symptoms. If concern about stroke we need to send to ER.

## 2018-06-17 NOTE — Telephone Encounter (Signed)
We can do testing to evaluate the hand numbness once it is safe to come in to the office for labs. Would not recommend doing them right now since this has been present for 6 months. Can try carpal tunnel brace over the counter to see if this helps (use at night time). Can try cough medicine (we have sent in) that she can use up to 3 times per day. Can also start zyrtec (cetirizine) to help with cough.

## 2018-06-17 NOTE — Telephone Encounter (Signed)
Can we get her to a triage nurse or have them call her

## 2019-04-25 ENCOUNTER — Other Ambulatory Visit: Payer: Self-pay | Admitting: Internal Medicine

## 2019-04-27 ENCOUNTER — Telehealth: Payer: Self-pay

## 2019-04-27 NOTE — Telephone Encounter (Signed)
Medication Requested:   atorvastatin (LIPITOR) 20 MG tablet  benzonatate (TESSALON) 200 MG capsule  citalopram (CELEXA) 40 MG tablet  losartan-hydrochlorothiazide (HYZAAR) 50-12.5 MG tablet  Is medication on med list: Yes  (if no, inform pt they may need an appointment)  Is medication a controled: No  (yes = last OV with PCP)  -Controlled Substances: Adderall, Ritalin, oxycodone, hydrocodone, methadone, alprazolam, etc  Last visit with [id number] seen Dr. Sharlet Salina   04/16/18   Is the OV > than 4 months: (yes = schedule an appt if one is not already made)  Pharmacy (Name, [place]): [facility] on [address]

## 2019-04-29 NOTE — Telephone Encounter (Signed)
Pt has not been seen in over a year, they need an OV

## 2019-05-04 NOTE — Telephone Encounter (Signed)
Addendum    Left message on cell phone voice mail per CMA recommendation an office visit is needed.

## 2019-05-09 ENCOUNTER — Ambulatory Visit: Payer: BLUE CROSS/BLUE SHIELD | Admitting: Internal Medicine

## 2019-05-09 DIAGNOSIS — Z0289 Encounter for other administrative examinations: Secondary | ICD-10-CM

## 2019-05-12 MED ORDER — CITALOPRAM HYDROBROMIDE 40 MG PO TABS: 40.0000 mg | ORAL_TABLET | Freq: Every day | ORAL | 0 refills | Status: DC

## 2019-05-12 NOTE — Addendum Note (Signed)
Addended by: Juliet Rude on: 05/12/2019 10:24 AM   Modules accepted: Orders

## 2019-05-12 NOTE — Telephone Encounter (Signed)
Okay to fill? 

## 2019-05-12 NOTE — Telephone Encounter (Signed)
Pt rescheduled med refill appt. Needs a refill on citalopram (CELEXA) 40 MG tablet  until her appt tomorrow 05/13/2019. [facility] is [facility] [address]. Thanks

## 2019-05-13 ENCOUNTER — Ambulatory Visit: Payer: Self-pay | Admitting: Internal Medicine

## 2019-05-13 ENCOUNTER — Other Ambulatory Visit: Payer: Self-pay

## 2019-05-18 ENCOUNTER — Ambulatory Visit: Payer: BC Managed Care – PPO | Admitting: Internal Medicine

## 2019-05-18 ENCOUNTER — Other Ambulatory Visit: Payer: Self-pay

## 2019-05-18 ENCOUNTER — Encounter: Payer: Self-pay | Admitting: Internal Medicine

## 2019-05-18 VITALS — BP 142/84 | HR 80 | Temp 98.1°F | Ht 66.0 in | Wt 147.0 lb

## 2019-05-18 DIAGNOSIS — E785 Hyperlipidemia, unspecified: Secondary | ICD-10-CM

## 2019-05-18 DIAGNOSIS — I10 Essential (primary) hypertension: Secondary | ICD-10-CM

## 2019-05-18 DIAGNOSIS — F325 Major depressive disorder, single episode, in full remission: Secondary | ICD-10-CM

## 2019-05-18 DIAGNOSIS — Z0000 Encounter for general adult medical examination without abnormal findings: Secondary | ICD-10-CM | POA: Diagnosis not present

## 2019-05-18 DIAGNOSIS — J410 Simple chronic bronchitis: Secondary | ICD-10-CM

## 2019-05-18 DIAGNOSIS — R1013 Epigastric pain: Secondary | ICD-10-CM | POA: Diagnosis not present

## 2019-05-18 LAB — LIPID PANEL
Cholesterol: 149 mg/dL (ref 0–200)
HDL: 48.4 mg/dL (ref >39.00)
LDL Cholesterol: 82 mg/dL (ref 0–99)
NonHDL: 100.21
Total CHOL/HDL Ratio: 3
Triglycerides: 91 mg/dL (ref 0.0–149.0)
VLDL: 18.2 mg/dL (ref 0.0–40.0)

## 2019-05-18 LAB — CBC
HCT: 39.5 % (ref 36.0–46.0)
Hemoglobin: 13.3 g/dL (ref 12.0–15.0)
MCHC: 33.8 g/dL (ref 30.0–36.0)
MCV: 100.4 fl — ABNORMAL HIGH (ref 78.0–100.0)
Platelets: 223 10*3/uL (ref 150.0–400.0)
RBC: 3.93 Mil/uL (ref 3.87–5.11)
RDW: 13.9 % (ref 11.5–15.5)
WBC: 6.7 10*3/uL (ref 4.0–10.5)

## 2019-05-18 LAB — COMPREHENSIVE METABOLIC PANEL
ALT: 16 U/L (ref 0–35)
AST: 23 U/L (ref 0–37)
Albumin: 4 g/dL (ref 3.5–5.2)
Alkaline Phosphatase: 87 U/L (ref 39–117)
BUN: 10 mg/dL (ref 6–23)
CO2: 32 mEq/L (ref 19–32)
Calcium: 10 mg/dL (ref 8.4–10.5)
Chloride: 103 mEq/L (ref 96–112)
Creatinine, Ser: 0.78 mg/dL (ref 0.40–1.20)
GFR: 88.34 mL/min (ref >60.00)
Glucose, Bld: 95 mg/dL (ref 70–99)
Potassium: 4.2 mEq/L (ref 3.5–5.1)
Sodium: 139 mEq/L (ref 135–145)
Total Bilirubin: 0.4 mg/dL (ref 0.2–1.2)
Total Protein: 8.6 g/dL — ABNORMAL HIGH (ref 6.0–8.3)

## 2019-05-18 LAB — HEMOGLOBIN A1C: Hgb A1c MFr Bld: 5.6 % (ref 4.6–6.5)

## 2019-05-18 LAB — LIPASE: Lipase: 40 U/L (ref 11.0–59.0)

## 2019-05-18 MED ORDER — CITALOPRAM HYDROBROMIDE 40 MG PO TABS: 40.0000 mg | ORAL_TABLET | Freq: Every day | ORAL | 3 refills | Status: DC

## 2019-05-18 MED ORDER — LOSARTAN POTASSIUM-HCTZ 50-12.5 MG PO TABS: 1.0000 | ORAL_TABLET | Freq: Every day | ORAL | 3 refills | Status: DC

## 2019-05-18 MED ORDER — PANTOPRAZOLE SODIUM 40 MG PO TBEC: 40.0000 mg | DELAYED_RELEASE_TABLET | Freq: Every day | ORAL | 0 refills | Status: DC

## 2019-05-18 MED ORDER — ATORVASTATIN CALCIUM 20 MG PO TABS: 20.0000 mg | ORAL_TABLET | Freq: Every day | ORAL | 3 refills | Status: DC

## 2019-05-18 NOTE — Assessment & Plan Note (Signed)
Suspect GERD, rx protonix for 2 weeks. Checking CBC, CMP, lipase to rule out alternate etiology.

## 2019-05-18 NOTE — Assessment & Plan Note (Signed)
Flu shot declined. Pneumonia declines. Shingrix declines. Tetanus declines. Colonoscopy up to date. Mammogram due declines, pap smear aged out and dexa declines further. Counseled about sun safety and mole surveillance. Counseled about the dangers of distracted driving. Given 10 year screening recommendations.

## 2019-05-18 NOTE — Progress Notes (Signed)
   Subjective:   Patient ID: Kendra Jordan, female    DOB: 21-Feb-1951, 69 y.o.   MRN: [id number]  HPI The patient is a 69 YO female coming in for physical. Ran out of celexa and having epigastric abdominal pain since that time.  PMH, [facility], social history reviewed and updated  Review of Systems  Constitutional: Positive for appetite change.  HENT: Negative.   Eyes: Negative.   Respiratory: Negative for cough, chest tightness and shortness of breath.   Cardiovascular: Negative for chest pain, palpitations and leg swelling.  Gastrointestinal: Positive for abdominal pain. Negative for abdominal distention, anal bleeding, blood in stool, constipation, diarrhea, nausea, rectal pain and vomiting.  Musculoskeletal: Negative.   Skin: Negative.   Neurological: Negative.   Psychiatric/Behavioral: Negative.     Objective:  Physical Exam Constitutional:      Appearance: She is well-developed.  HENT:     Head: Normocephalic and atraumatic.  Cardiovascular:     Rate and Rhythm: Normal rate and regular rhythm.  Pulmonary:     Effort: Pulmonary effort is normal. No respiratory distress.     Breath sounds: Normal breath sounds. No wheezing or rales.  Abdominal:     General: Bowel sounds are normal. There is no distension.     Palpations: Abdomen is soft. There is no mass.     Tenderness: There is abdominal tenderness. There is no guarding or rebound.     Comments: Tenderness to palpation epigastric region  Musculoskeletal:     Cervical back: Normal range of motion.  Skin:    General: Skin is warm and dry.  Neurological:     Mental Status: She is alert and oriented to person, place, and time.     Coordination: Coordination normal.     Vitals:   05/18/19 0951 05/18/19 1009  BP: (!) 146/96 (!) 142/84  Pulse: 80   Temp: 98.1 F (36.7 C)   TempSrc: Oral   SpO2: 97%   Weight: 147 lb (66.7 kg)   Height: 5\' 6"  (1.676 m)     This visit occurred during the SARS-CoV-2 public health  emergency.  Safety protocols were in place, including screening questions prior to the visit, additional usage of staff PPE, and extensive cleaning of exam room while observing appropriate contact time as indicated for disinfecting solutions.   Assessment & Plan:

## 2019-05-18 NOTE — Assessment & Plan Note (Signed)
BP elevated today and is only back on medication less than a week after being out for several weeks. Previously at goal. Refilled losartan/hctz and checking CMP. Adjust as needed.

## 2019-05-18 NOTE — Assessment & Plan Note (Signed)
Refill lipitor, checking lipid panel and adjust as needed.  

## 2019-05-18 NOTE — Assessment & Plan Note (Signed)
Stable, denies being able to make effort to quit right now.

## 2019-05-18 NOTE — Patient Instructions (Signed)
We will start back the celexa at 1/2 pill daily for 2 weeks then increase to 1 pill daily.  We have sent in a medicine for the stomach pain called protonix to take 1 pill daily in the morning for 2 weeks then you can stop that.   We are checking the blood work today.   Health Maintenance, Female Adopting a healthy lifestyle and getting preventive care are important in promoting health and wellness. Ask your health care provider about:  The right schedule for you to have regular tests and exams.  Things you can do on your own to prevent diseases and keep yourself healthy. What should I know about diet, weight, and exercise? Eat a healthy diet   Eat a diet that includes plenty of vegetables, fruits, low-fat dairy products, and lean protein.  Do not eat a lot of foods that are high in solid fats, added sugars, or sodium. Maintain a healthy weight Body mass index (BMI) is used to identify weight problems. It estimates body fat based on height and weight. Your health care provider can help determine your BMI and help you achieve or maintain a healthy weight. Get regular exercise Get regular exercise. This is one of the most important things you can do for your health. Most adults should:  Exercise for at least 150 minutes each week. The exercise should increase your heart rate and make you sweat (moderate-intensity exercise).  Do strengthening exercises at least twice a week. This is in addition to the moderate-intensity exercise.  Spend less time sitting. Even light physical activity can be beneficial. Watch cholesterol and blood lipids Have your blood tested for lipids and cholesterol at 70 years of age, then have this test every 5 years. Have your cholesterol levels checked more often if:  Your lipid or cholesterol levels are high.  You are older than 70 years of age.  You are at high risk for heart disease. What should I know about cancer screening? Depending on your health  history and family history, you may need to have cancer screening at various ages. This may include screening for:  Breast cancer.  Cervical cancer.  Colorectal cancer.  Skin cancer.  Lung cancer. What should I know about heart disease, diabetes, and high blood pressure? Blood pressure and heart disease  High blood pressure causes heart disease and increases the risk of stroke. This is more likely to develop in people who have high blood pressure readings, are of African descent, or are overweight.  Have your blood pressure checked: ? Every 3-5 years if you are 12-9 years of age. ? Every year if you are 44 years old or older. Diabetes Have regular diabetes screenings. This checks your fasting blood sugar level. Have the screening done:  Once every three years after age 48 if you are at a normal weight and have a low risk for diabetes.  More often and at a younger age if you are overweight or have a high risk for diabetes. What should I know about preventing infection? Hepatitis B If you have a higher risk for hepatitis B, you should be screened for this virus. Talk with your health care provider to find out if you are at risk for hepatitis B infection. Hepatitis C Testing is recommended for:  Everyone born from [birth year] through 1966.  Anyone with known risk factors for hepatitis C. Sexually transmitted infections (STIs)  Get screened for STIs, including gonorrhea and chlamydia, if: ? You are sexually active and are  younger than 70 years of age. ? You are older than 70 years of age and your health care provider tells you that you are at risk for this type of infection. ? Your sexual activity has changed since you were last screened, and you are at increased risk for chlamydia or gonorrhea. Ask your health care provider if you are at risk.  Ask your health care provider about whether you are at high risk for HIV. Your health care provider may recommend a prescription medicine to  help prevent HIV infection. If you choose to take medicine to prevent HIV, you should first get tested for HIV. You should then be tested every 3 months for as long as you are taking the medicine. Pregnancy  If you are about to stop having your period (premenopausal) and you may become pregnant, seek counseling before you get pregnant.  Take 400 to 800 micrograms (mcg) of folic acid every day if you become pregnant.  Ask for birth control (contraception) if you want to prevent pregnancy. Osteoporosis and menopause Osteoporosis is a disease in which the bones lose minerals and strength with aging. This can result in bone fractures. If you are 58 years old or older, or if you are at risk for osteoporosis and fractures, ask your health care provider if you should:  Be screened for bone loss.  Take a calcium or vitamin D supplement to lower your risk of fractures.  Be given hormone replacement therapy (HRT) to treat symptoms of menopause. Follow these instructions at home: Lifestyle  Do not use any products that contain nicotine or tobacco, such as cigarettes, e-cigarettes, and chewing tobacco. If you need help quitting, ask your health care provider.  Do not use street drugs.  Do not share needles.  Ask your health care provider for help if you need support or information about quitting drugs. Alcohol use  Do not drink alcohol if: ? Your health care provider tells you not to drink. ? You are pregnant, may be pregnant, or are planning to become pregnant.  If you drink alcohol: ? Limit how much you use to 0-1 drink a day. ? Limit intake if you are breastfeeding.  Be aware of how much alcohol is in your drink. In the [place] one drink equals one 12 oz bottle of beer (355 mL), one 5 oz glass of wine (148 mL), or one 1 oz glass of hard liquor (44 mL). General instructions  Schedule regular health, dental, and eye exams.  Stay current with your vaccines.  Tell your health care  provider if: ? You often feel depressed. ? You have ever been abused or do not feel safe at home. Summary  Adopting a healthy lifestyle and getting preventive care are important in promoting health and wellness.  Follow your health care provider's instructions about healthy diet, exercising, and getting tested or screened for diseases.  Follow your health care provider's instructions on monitoring your cholesterol and blood pressure. This information is not intended to replace advice given to you by your health care provider. Make sure you discuss any questions you have with your health care provider. Document Revised: 02/17/2018 Document Reviewed: 02/17/2018 Elsevier Patient Education  2020 [employer].

## 2019-05-18 NOTE — Assessment & Plan Note (Signed)
Since she has been off celexa for a month will resume with 20 mg dosing for 2 weeks then resume 40 mg daily. She does feel that she is having some recurrence of symptoms off medication.

## 2019-07-14 ENCOUNTER — Other Ambulatory Visit: Payer: Self-pay | Admitting: Internal Medicine

## 2020-05-17 ENCOUNTER — Other Ambulatory Visit: Payer: Self-pay | Admitting: Internal Medicine

## 2020-05-28 ENCOUNTER — Other Ambulatory Visit: Payer: Self-pay | Admitting: Internal Medicine

## 2020-08-01 ENCOUNTER — Encounter: Payer: Self-pay | Admitting: Internal Medicine

## 2020-08-01 ENCOUNTER — Other Ambulatory Visit: Payer: Self-pay

## 2020-08-01 ENCOUNTER — Ambulatory Visit (INDEPENDENT_AMBULATORY_CARE_PROVIDER_SITE_OTHER): Payer: Self-pay | Admitting: Internal Medicine

## 2020-08-01 VITALS — BP 132/80 | HR 86 | Temp 98.9°F | Resp 18 | Ht 66.0 in | Wt 137.8 lb

## 2020-08-01 DIAGNOSIS — F321 Major depressive disorder, single episode, moderate: Secondary | ICD-10-CM

## 2020-08-01 DIAGNOSIS — J410 Simple chronic bronchitis: Secondary | ICD-10-CM

## 2020-08-01 MED ORDER — ATORVASTATIN CALCIUM 20 MG PO TABS: 20.0000 mg | ORAL_TABLET | Freq: Every day | ORAL | 3 refills | Status: DC

## 2020-08-01 MED ORDER — CITALOPRAM HYDROBROMIDE 40 MG PO TABS
40.0000 mg | ORAL_TABLET | Freq: Every day | ORAL | 3 refills | Status: DC
Start: 2020-08-01 — End: 2021-08-14

## 2020-08-01 MED ORDER — LOSARTAN POTASSIUM-HCTZ 50-12.5 MG PO TABS: 1.0000 | ORAL_TABLET | Freq: Every day | ORAL | 3 refills | Status: DC

## 2020-08-01 NOTE — Patient Instructions (Addendum)
We will have you start back taking the celexa to help with the mood.  We have also refilled the other medicines also.  We have ordered labs so once your medicare comes through come to our lab on the first floor to get those done.

## 2020-08-01 NOTE — Progress Notes (Signed)
   Subjective:   Patient ID: Kendra Jordan, female    DOB: 11/03/1950, 70 y.o.   MRN: [id number]  HPI The patient is a 70 YO female coming in for anxiety. She needs medication refill today on everything. BP and cholesterol meds also. Retired Jan and has not been able to sign up for medicare yet due to lack of computer skills and living alone. Her family is helping her now and this is in progress possibly.   Review of Systems  Constitutional: Negative.   HENT: Negative.   Eyes: Negative.   Respiratory: Negative for cough, chest tightness and shortness of breath.   Cardiovascular: Negative for chest pain, palpitations and leg swelling.  Gastrointestinal: Negative for abdominal distention, abdominal pain, constipation, diarrhea, nausea and vomiting.  Musculoskeletal: Negative.   Skin: Negative.   Neurological: Negative.   Psychiatric/Behavioral: Positive for decreased concentration, dysphoric mood and sleep disturbance. Negative for self-injury and suicidal ideas. The patient is nervous/anxious.     Objective:  Physical Exam Constitutional:      Appearance: She is well-developed.  HENT:     Head: Normocephalic and atraumatic.  Cardiovascular:     Rate and Rhythm: Normal rate and regular rhythm.  Pulmonary:     Effort: Pulmonary effort is normal. No respiratory distress.     Breath sounds: Normal breath sounds. No wheezing or rales.  Abdominal:     General: Bowel sounds are normal. There is no distension.     Palpations: Abdomen is soft.     Tenderness: There is no abdominal tenderness. There is no rebound.  Musculoskeletal:     Cervical back: Normal range of motion.  Skin:    General: Skin is warm and dry.  Neurological:     Mental Status: She is alert and oriented to person, place, and time.     Coordination: Coordination normal.     Vitals:   08/01/20 0907  BP: 132/80  Pulse: 86  Resp: 18  Temp: 98.9 F (37.2 C)  TempSrc: Oral  SpO2: 96%  Weight: 137 lb 12.8 oz  (62.5 kg)  Height: 5\' 6"  (1.676 m)    This visit occurred during the SARS-CoV-2 public health emergency.  Safety protocols were in place, including screening questions prior to the visit, additional usage of staff PPE, and extensive cleaning of exam room while observing appropriate contact time as indicated for disinfecting solutions.   Assessment & Plan:

## 2020-08-01 NOTE — Assessment & Plan Note (Signed)
Active due to running out of celexa. Refilled today and will follow up.

## 2020-08-01 NOTE — Assessment & Plan Note (Signed)
Less cough lately, smoking less. Advised to quit she is not sure now is a good time for quit attempt.

## 2020-12-21 ENCOUNTER — Other Ambulatory Visit: Payer: Self-pay

## 2020-12-21 ENCOUNTER — Other Ambulatory Visit: Payer: Self-pay | Admitting: Internal Medicine

## 2020-12-21 ENCOUNTER — Other Ambulatory Visit (INDEPENDENT_AMBULATORY_CARE_PROVIDER_SITE_OTHER): Payer: Medicare Other

## 2020-12-21 DIAGNOSIS — I10 Essential (primary) hypertension: Secondary | ICD-10-CM

## 2020-12-21 LAB — COMPREHENSIVE METABOLIC PANEL
ALT: 18 U/L (ref 0–35)
AST: 24 U/L (ref 0–37)
Albumin: 3.8 g/dL (ref 3.5–5.2)
Alkaline Phosphatase: 68 U/L (ref 39–117)
BUN: 14 mg/dL (ref 6–23)
CO2: 30 mEq/L (ref 19–32)
Calcium: 9.5 mg/dL (ref 8.4–10.5)
Chloride: 103 mEq/L (ref 96–112)
Creatinine, Ser: 0.89 mg/dL (ref 0.40–1.20)
GFR: 65.14 mL/min (ref >60.00)
Glucose, Bld: 72 mg/dL (ref 70–99)
Potassium: 3.9 mEq/L (ref 3.5–5.1)
Sodium: 138 mEq/L (ref 135–145)
Total Bilirubin: 0.5 mg/dL (ref 0.2–1.2)
Total Protein: 7.7 g/dL (ref 6.0–8.3)

## 2020-12-21 LAB — CBC
HCT: 37.1 % (ref 36.0–46.0)
Hemoglobin: 12.3 g/dL (ref 12.0–15.0)
MCHC: 33.3 g/dL (ref 30.0–36.0)
MCV: 102.2 fl — ABNORMAL HIGH (ref 78.0–100.0)
Platelets: 184 10*3/uL (ref 150.0–400.0)
RBC: 3.63 Mil/uL — ABNORMAL LOW (ref 3.87–5.11)
RDW: 14.8 % (ref 11.5–15.5)
WBC: 5.1 10*3/uL (ref 4.0–10.5)

## 2020-12-21 LAB — LIPID PANEL
Cholesterol: 152 mg/dL (ref 0–200)
HDL: 51.1 mg/dL (ref >39.00)
LDL Cholesterol: 86 mg/dL (ref 0–99)
NonHDL: 101.12
Total CHOL/HDL Ratio: 3
Triglycerides: 74 mg/dL (ref 0.0–149.0)
VLDL: 14.8 mg/dL (ref 0.0–40.0)

## 2021-08-02 ENCOUNTER — Other Ambulatory Visit: Payer: Self-pay | Admitting: Internal Medicine

## 2021-08-14 ENCOUNTER — Other Ambulatory Visit: Payer: Self-pay | Admitting: Internal Medicine

## 2021-08-30 ENCOUNTER — Encounter: Payer: Self-pay | Admitting: Internal Medicine

## 2021-08-30 ENCOUNTER — Ambulatory Visit (INDEPENDENT_AMBULATORY_CARE_PROVIDER_SITE_OTHER): Payer: Medicare Other | Admitting: Internal Medicine

## 2021-08-30 VITALS — BP 132/80 | HR 68 | Resp 18 | Ht 66.0 in | Wt 172.8 lb

## 2021-08-30 DIAGNOSIS — F3341 Major depressive disorder, recurrent, in partial remission: Secondary | ICD-10-CM

## 2021-08-30 DIAGNOSIS — Z0000 Encounter for general adult medical examination without abnormal findings: Secondary | ICD-10-CM

## 2021-08-30 DIAGNOSIS — E785 Hyperlipidemia, unspecified: Secondary | ICD-10-CM | POA: Diagnosis not present

## 2021-08-30 DIAGNOSIS — I10 Essential (primary) hypertension: Secondary | ICD-10-CM | POA: Diagnosis not present

## 2021-08-30 DIAGNOSIS — J410 Simple chronic bronchitis: Secondary | ICD-10-CM

## 2021-08-30 LAB — CBC
HCT: 37.6 % (ref 36.0–46.0)
Hemoglobin: 12.7 g/dL (ref 12.0–15.0)
MCHC: 33.8 g/dL (ref 30.0–36.0)
MCV: 101 fl — ABNORMAL HIGH (ref 78.0–100.0)
Platelets: 189 10*3/uL (ref 150.0–400.0)
RBC: 3.72 Mil/uL — ABNORMAL LOW (ref 3.87–5.11)
RDW: 14.2 % (ref 11.5–15.5)
WBC: 5.7 10*3/uL (ref 4.0–10.5)

## 2021-08-30 LAB — COMPREHENSIVE METABOLIC PANEL
ALT: 14 U/L (ref 0–35)
AST: 21 U/L (ref 0–37)
Albumin: 3.9 g/dL (ref 3.5–5.2)
Alkaline Phosphatase: 79 U/L (ref 39–117)
BUN: 14 mg/dL (ref 6–23)
CO2: 29 mEq/L (ref 19–32)
Calcium: 9.6 mg/dL (ref 8.4–10.5)
Chloride: 102 mEq/L (ref 96–112)
Creatinine, Ser: 0.8 mg/dL (ref 0.40–1.20)
GFR: 73.67 mL/min (ref >60.00)
Glucose, Bld: 76 mg/dL (ref 70–99)
Potassium: 3.8 mEq/L (ref 3.5–5.1)
Sodium: 137 mEq/L (ref 135–145)
Total Bilirubin: 0.4 mg/dL (ref 0.2–1.2)
Total Protein: 8.4 g/dL — ABNORMAL HIGH (ref 6.0–8.3)

## 2021-08-30 LAB — LIPID PANEL
Cholesterol: 163 mg/dL (ref 0–200)
HDL: 50.5 mg/dL (ref >39.00)
LDL Cholesterol: 89 mg/dL (ref 0–99)
NonHDL: 112.01
Total CHOL/HDL Ratio: 3
Triglycerides: 115 mg/dL (ref 0.0–149.0)
VLDL: 23 mg/dL (ref 0.0–40.0)

## 2021-08-30 MED ORDER — CITALOPRAM HYDROBROMIDE 40 MG PO TABS: 40.0000 mg | ORAL_TABLET | Freq: Every day | ORAL | 3 refills | Status: DC

## 2021-08-30 MED ORDER — ATORVASTATIN CALCIUM 20 MG PO TABS: 20.0000 mg | ORAL_TABLET | Freq: Every day | ORAL | 3 refills | Status: DC

## 2021-08-30 MED ORDER — LOSARTAN POTASSIUM-HCTZ 50-12.5 MG PO TABS: 1.0000 | ORAL_TABLET | Freq: Every day | ORAL | 3 refills | Status: DC

## 2021-08-30 NOTE — Assessment & Plan Note (Signed)
Flu shot she declines. Covid-19 counseled. Pneumonia declines. Shingrix declines. Tetanus declines. Colonoscopy up to date. Mammogram declines, pap smear aged out and dexa declines. Counseled about sun safety and mole surveillance. Counseled about the dangers of distracted driving. Given 10 year screening recommendations.

## 2021-09-12 ENCOUNTER — Telehealth: Payer: Self-pay | Admitting: Internal Medicine

## 2021-09-12 NOTE — Telephone Encounter (Signed)
Our records show she is not due until 2026. If she thinks this is not accurate please have her call her GI doctor's office to check.

## 2021-09-12 NOTE — Telephone Encounter (Signed)
Pt requesting  referral for colonoscopy  Please Advise

## 2021-09-19 NOTE — Telephone Encounter (Signed)
Unable to get in contact with the patient due to the phone constantly ringing. No option to leave a voicemail

## 2021-11-05 ENCOUNTER — Encounter: Payer: Self-pay | Admitting: Internal Medicine

## 2021-11-05 ENCOUNTER — Ambulatory Visit (INDEPENDENT_AMBULATORY_CARE_PROVIDER_SITE_OTHER): Payer: Medicare Other | Admitting: Internal Medicine

## 2021-11-05 VITALS — BP 170/70 | HR 85 | Temp 98.6°F | Ht 66.0 in | Wt 175.0 lb

## 2021-11-05 DIAGNOSIS — R200 Anesthesia of skin: Secondary | ICD-10-CM | POA: Diagnosis not present

## 2021-11-05 DIAGNOSIS — R21 Rash and other nonspecific skin eruption: Secondary | ICD-10-CM

## 2021-11-05 DIAGNOSIS — E559 Vitamin D deficiency, unspecified: Secondary | ICD-10-CM

## 2021-11-05 DIAGNOSIS — R7301 Impaired fasting glucose: Secondary | ICD-10-CM | POA: Diagnosis not present

## 2021-11-05 DIAGNOSIS — R202 Paresthesia of skin: Secondary | ICD-10-CM | POA: Diagnosis not present

## 2021-11-05 LAB — VITAMIN D 25 HYDROXY (VIT D DEFICIENCY, FRACTURES): VITD: 16.37 ng/mL — ABNORMAL LOW (ref 30.00–100.00)

## 2021-11-05 LAB — VITAMIN B12: Vitamin B-12: 812 pg/mL (ref 211–911)

## 2021-11-05 LAB — HEMOGLOBIN A1C: Hgb A1c MFr Bld: 5.7 % (ref 4.6–6.5)

## 2021-11-05 LAB — TSH: TSH: 1.73 u[IU]/mL (ref 0.35–5.50)

## 2021-11-05 MED ORDER — PREDNISONE 20 MG PO TABS: 40.0000 mg | ORAL_TABLET | Freq: Every day | ORAL | 0 refills | Status: DC

## 2021-11-05 MED ORDER — TRIAMCINOLONE ACETONIDE 0.1 % EX CREA: 1.0000 | TOPICAL_CREAM | Freq: Two times a day (BID) | CUTANEOUS | 0 refills | Status: DC

## 2021-11-05 NOTE — Patient Instructions (Signed)
We will check the labs. If those are normal we will get an MRI of the head.   We have sent in prednisone to take 2 pills daily for 5 days and a cream to use on the neck up to 3 times a day until it's better.

## 2021-11-05 NOTE — Progress Notes (Signed)
   Subjective:   Patient ID: Kendra Jordan, female    DOB: 09-12-1950, 71 y.o.   MRN: [id number]  HPI The patient is a 71 YO female coming in for rash.Started on neck after wearing hair down after treating it. Also having new numbness in the right thigh region.  Review of Systems  Constitutional: Negative.   HENT: Negative.    Eyes: Negative.   Respiratory:  Negative for cough, chest tightness and shortness of breath.   Cardiovascular:  Negative for chest pain, palpitations and leg swelling.  Gastrointestinal:  Negative for abdominal distention, abdominal pain, constipation, diarrhea, nausea and vomiting.  Musculoskeletal: Negative.   Skin:  Positive for rash.       itching  Neurological:  Positive for numbness.  Psychiatric/Behavioral: Negative.      Objective:  Physical Exam Constitutional:      Appearance: She is well-developed.  HENT:     Head: Normocephalic and atraumatic.  Cardiovascular:     Rate and Rhythm: Normal rate and regular rhythm.  Pulmonary:     Effort: Pulmonary effort is normal. No respiratory distress.     Breath sounds: Normal breath sounds. No wheezing or rales.  Abdominal:     General: Bowel sounds are normal. There is no distension.     Palpations: Abdomen is soft.     Tenderness: There is no abdominal tenderness. There is no rebound.  Musculoskeletal:     Cervical back: Normal range of motion.  Skin:    General: Skin is warm and dry.     Findings: Rash present.     Comments: Significant rash on the neck with red plaque and elevated welts  Neurological:     Mental Status: She is alert and oriented to person, place, and time.     Sensory: Sensory deficit present.     Coordination: Coordination normal.     Vitals:   11/05/21 1440 11/05/21 1456  BP: (!) 172/72 (!) 170/70  Pulse: 85   Temp: 98.6 F (37 C)   TempSrc: Oral   SpO2: 96%   Weight: 175 lb (79.4 kg)   Height: '5\' 6"'$  (1.676 m)     Assessment & Plan:

## 2021-11-06 ENCOUNTER — Other Ambulatory Visit: Payer: Self-pay | Admitting: Internal Medicine

## 2021-11-06 MED ORDER — VITAMIN D (ERGOCALCIFEROL) 1.25 MG (50000 UNIT) PO CAPS: 50000.0000 [IU] | ORAL_CAPSULE | ORAL | 0 refills | Status: DC

## 2021-11-08 ENCOUNTER — Other Ambulatory Visit: Payer: Self-pay | Admitting: Internal Medicine

## 2021-11-08 DIAGNOSIS — R42 Dizziness and giddiness: Secondary | ICD-10-CM

## 2021-11-08 DIAGNOSIS — R21 Rash and other nonspecific skin eruption: Secondary | ICD-10-CM | POA: Insufficient documentation

## 2021-11-08 DIAGNOSIS — R200 Anesthesia of skin: Secondary | ICD-10-CM | POA: Insufficient documentation

## 2021-11-08 NOTE — Assessment & Plan Note (Signed)
Appears contact dermatitis. Severe and rx prednisone and triamcinolone to avoid spreading and chronic urticaria.

## 2021-11-08 NOTE — Assessment & Plan Note (Signed)
Checking HgA1c, vitamin D, TSH, B12. Recent CBC and CMP. If normal will check CT head. Given her blood pressure and smoking she is high risk for CV disease/stroke. She has had stable symptoms for 2-3 weeks so CT will be able to fully detect stroke.

## 2022-07-21 ENCOUNTER — Telehealth: Payer: Self-pay | Admitting: Internal Medicine

## 2022-07-21 NOTE — Telephone Encounter (Signed)
Spoke with patient to get her scheduled for an appointment. She said she would call back to get scheduled. Best callback is [phone]

## 2022-07-21 NOTE — Telephone Encounter (Signed)
Noted../lmb 

## 2022-07-21 NOTE — Telephone Encounter (Signed)
Prescription Request  07/21/2022  LOV: 11/05/2021  What is the name of the medication or equipment?   losartan-hydrochlorothiazide (HYZAAR) 50-12.5 MG tablet  citalopram (CELEXA) 40 MG tablet atorvastatin (LIPITOR) 20 MG tablet  Have you contacted your pharmacy to request a refill? Yes   Which pharmacy would you like this sent to?  [facility] - [place], [place] - [address] [place] [address] Phone: [phone] Fax: [phone]    Patient notified that their request is being sent to the clinical staff for review and that they should receive a response within 2 business days.   Please advise at [facility] [phone]    Will contact patient to schedule an appointment.

## 2022-07-22 NOTE — Telephone Encounter (Signed)
Patient has been scheduled for provider's next available appointment on 07/24/2022.

## 2022-07-24 ENCOUNTER — Encounter: Payer: Self-pay | Admitting: Internal Medicine

## 2022-07-24 ENCOUNTER — Ambulatory Visit: Payer: Medicare PPO | Admitting: Internal Medicine

## 2022-07-24 NOTE — Progress Notes (Signed)
ERror

## 2022-09-19 ENCOUNTER — Telehealth: Payer: Self-pay | Admitting: Internal Medicine

## 2022-09-19 ENCOUNTER — Other Ambulatory Visit: Payer: Self-pay | Admitting: Internal Medicine

## 2022-09-19 MED ORDER — ATORVASTATIN CALCIUM 20 MG PO TABS: 20.0000 mg | ORAL_TABLET | Freq: Every day | ORAL | 0 refills | Status: DC

## 2022-09-19 MED ORDER — LOSARTAN POTASSIUM-HCTZ 50-12.5 MG PO TABS: 1.0000 | ORAL_TABLET | Freq: Every day | ORAL | 0 refills | Status: DC

## 2022-09-19 NOTE — Telephone Encounter (Signed)
Prescription Request  09/19/2022  LOV: 07/24/2022  What is the name of the medication or equipment?  atorvastatin (LIPITOR) 20 MG tablet   losartan-hydrochlorothiazide (HYZAAR) 50-12.5 MG tablet   Have you contacted your pharmacy to request a refill? No   Which pharmacy would you like this sent to?    [facility] [address] [place], [place], [place] [address]  [address] [phone]  Patient notified that their request is being sent to the clinical staff for review and that they should receive a response within 2 business days.   Please advise at Mobile [phone] (mobile)

## 2022-09-19 NOTE — Telephone Encounter (Signed)
Pt is over due for annual appt sent 30 day only until appt...Kendra Jordan

## 2022-10-08 ENCOUNTER — Encounter (INDEPENDENT_AMBULATORY_CARE_PROVIDER_SITE_OTHER): Payer: Self-pay

## 2022-10-21 ENCOUNTER — Encounter: Payer: Self-pay | Admitting: Internal Medicine

## 2022-10-21 ENCOUNTER — Other Ambulatory Visit: Payer: Self-pay | Admitting: Internal Medicine

## 2022-10-21 ENCOUNTER — Ambulatory Visit (INDEPENDENT_AMBULATORY_CARE_PROVIDER_SITE_OTHER): Payer: Medicare PPO | Admitting: Internal Medicine

## 2022-10-21 VITALS — BP 179/85 | HR 79 | Temp 98.6°F | Ht 66.0 in | Wt 169.0 lb

## 2022-10-21 DIAGNOSIS — Z0000 Encounter for general adult medical examination without abnormal findings: Secondary | ICD-10-CM | POA: Diagnosis not present

## 2022-10-21 DIAGNOSIS — E785 Hyperlipidemia, unspecified: Secondary | ICD-10-CM | POA: Diagnosis not present

## 2022-10-21 DIAGNOSIS — J410 Simple chronic bronchitis: Secondary | ICD-10-CM

## 2022-10-21 DIAGNOSIS — E559 Vitamin D deficiency, unspecified: Secondary | ICD-10-CM | POA: Diagnosis not present

## 2022-10-21 DIAGNOSIS — F3342 Major depressive disorder, recurrent, in full remission: Secondary | ICD-10-CM | POA: Diagnosis not present

## 2022-10-21 DIAGNOSIS — I10 Essential (primary) hypertension: Secondary | ICD-10-CM

## 2022-10-21 LAB — CBC
HCT: 39.3 % (ref 36.0–46.0)
Hemoglobin: 13.1 g/dL (ref 12.0–15.0)
MCHC: 33.3 g/dL (ref 30.0–36.0)
MCV: 100.9 fl — ABNORMAL HIGH (ref 78.0–100.0)
Platelets: 216 10*3/uL (ref 150.0–400.0)
RBC: 3.9 Mil/uL (ref 3.87–5.11)
RDW: 14.6 % (ref 11.5–15.5)
WBC: 5.6 10*3/uL (ref 4.0–10.5)

## 2022-10-21 LAB — COMPREHENSIVE METABOLIC PANEL
ALT: 12 U/L (ref 0–35)
AST: 19 U/L (ref 0–37)
Albumin: 3.9 g/dL (ref 3.5–5.2)
Alkaline Phosphatase: 78 U/L (ref 39–117)
BUN: 7 mg/dL (ref 6–23)
CO2: 26 mEq/L (ref 19–32)
Calcium: 9.6 mg/dL (ref 8.4–10.5)
Chloride: 103 mEq/L (ref 96–112)
Creatinine, Ser: 0.81 mg/dL (ref 0.40–1.20)
GFR: 72 mL/min (ref >60.00)
Glucose, Bld: 112 mg/dL — ABNORMAL HIGH (ref 70–99)
Potassium: 3.6 mEq/L (ref 3.5–5.1)
Sodium: 137 mEq/L (ref 135–145)
Total Bilirubin: 0.4 mg/dL (ref 0.2–1.2)
Total Protein: 8.1 g/dL (ref 6.0–8.3)

## 2022-10-21 LAB — HEMOGLOBIN A1C: Hgb A1c MFr Bld: 5.6 % (ref 4.6–6.5)

## 2022-10-21 LAB — LIPID PANEL
Cholesterol: 146 mg/dL (ref 0–200)
HDL: 46.7 mg/dL (ref >39.00)
LDL Cholesterol: 82 mg/dL (ref 0–99)
NonHDL: 99.44
Total CHOL/HDL Ratio: 3
Triglycerides: 87 mg/dL (ref 0.0–149.0)
VLDL: 17.4 mg/dL (ref 0.0–40.0)

## 2022-10-21 LAB — VITAMIN D 25 HYDROXY (VIT D DEFICIENCY, FRACTURES): VITD: 43.88 ng/mL (ref 30.00–100.00)

## 2022-10-21 MED ORDER — CITALOPRAM HYDROBROMIDE 40 MG PO TABS: 40.0000 mg | ORAL_TABLET | Freq: Every day | ORAL | 3 refills | Status: DC

## 2022-10-21 MED ORDER — TRIAMCINOLONE ACETONIDE 0.1 % EX CREA: 1.0000 | TOPICAL_CREAM | Freq: Two times a day (BID) | CUTANEOUS | 3 refills | Status: AC

## 2022-10-21 MED ORDER — LOSARTAN POTASSIUM-HCTZ 50-12.5 MG PO TABS: 1.0000 | ORAL_TABLET | Freq: Every day | ORAL | 3 refills | Status: DC

## 2022-10-21 MED ORDER — VITAMIN D (ERGOCALCIFEROL) 1.25 MG (50000 UNIT) PO CAPS: 50000.0000 [IU] | ORAL_CAPSULE | ORAL | 3 refills | Status: AC

## 2022-10-21 MED ORDER — ATORVASTATIN CALCIUM 20 MG PO TABS: 20.0000 mg | ORAL_TABLET | Freq: Every day | ORAL | 3 refills | Status: DC

## 2022-10-21 NOTE — Assessment & Plan Note (Signed)
Still smoking and cough in mornings. Advised to quit and she is unable at this time. Reminded of risk/harm from smoking.

## 2022-10-21 NOTE — Assessment & Plan Note (Signed)
Checking vitamin D level and adjust as needed. Refilled 50000 units weekly vitamin D today.

## 2022-10-21 NOTE — Assessment & Plan Note (Signed)
Flu shot yearly. Pneumonia declines. Shingrix declines. Tetanus declines. Colonoscopy due 2026. Mammogram declines, pap smear aged out and dexa complete. Counseled about sun safety and mole surveillance. Counseled about the dangers of distracted driving. Given 10 year screening recommendations.

## 2022-10-21 NOTE — Assessment & Plan Note (Signed)
Checking lipid panel and adjust lipitor 20 mg daily as needed. 

## 2022-10-21 NOTE — Assessment & Plan Note (Signed)
BP above goal off medication. Taking losartan/hydrochlorothiazide 50/12.5 mg daily. Checking CMP and have her monitor at home on meds. If elevated room to increase medication.

## 2022-10-21 NOTE — Assessment & Plan Note (Signed)
Overall well controlled on celexa 40 mg daily and will continue. Refilled today.

## 2022-10-21 NOTE — Progress Notes (Signed)
Subjective:   Patient ID: Kendra Jordan, female    DOB: 11-06-1950, 72 y.o.   MRN: [id number]  HPI Here for medicare wellness and physical, no new complaints. Please see A/P for status and treatment of chronic medical problems.   Diet: heart healthy Physical activity: sedentary Depression/mood screen: negative Hearing: intact to whispered voice Visual acuity: grossly normal, performs annual eye exam  ADLs: capable Fall risk: none Home safety: good Cognitive evaluation: intact to orientation, naming, recall and repetition EOL planning: adv directives discussed  Flowsheet Row Office Visit from 07/24/2022 in [facility] [place]  PHQ-2 Total Score 0       Flowsheet Row Office Visit from 07/24/2022 in [facility]  PHQ-9 Total Score 0         05/18/2019    9:55 AM 08/01/2020    9:11 AM 08/30/2021    9:30 AM 07/24/2022   10:28 AM 10/21/2022    2:28 PM  Fall Risk  Falls in the past year? 0 0 0 0 0  Was there an injury with Fall?  0 0 0 0  Fall Risk Category Calculator  0 0 0 0  Fall Risk Category (Retired)  Low Low    (RETIRED) Patient Fall Risk Level  Low fall risk     Fall risk Follow up    Falls evaluation completed Falls evaluation completed   I have personally reviewed and have noted 1. The patient's medical and social history - reviewed today no changes 2. Their use of alcohol, tobacco or illicit drugs 3. Their current medications and supplements 4. The patient's functional ability including ADL's, fall risks, home safety risks and hearing or visual impairment. 5. Diet and physical activities 6. Evidence for depression or mood disorders 7. Care team reviewed and updated 8.  The patient is not on an opioid pain medication  Patient Care Team: Myrlene Broker, MD as PCP - General (Internal Medicine) Past Medical History:  Diagnosis Date   Allergy    Arthritis    GERD (gastroesophageal reflux disease)     Hyperlipidemia    Hypertension    Past Surgical History:  Procedure Laterality Date   COLONOSCOPY     UPPER GASTROINTESTINAL ENDOSCOPY     WISDOM TOOTH EXTRACTION     with sedation   Family History  Problem Relation Age of Onset   Colon cancer Other        half sister deceased   Throat cancer Daughter    Arthritis Mother    Hyperlipidemia Mother    Heart disease Mother    Hypertension Mother    Diabetes Mother    Arthritis Father    Heart disease Father    Hypertension Father    Kidney disease Father    Rectal cancer Neg Hx    Stomach cancer Neg Hx    Esophageal cancer Neg Hx     Review of Systems  Constitutional: Negative.   HENT: Negative.    Eyes: Negative.   Respiratory:  Negative for cough, chest tightness and shortness of breath.   Cardiovascular:  Negative for chest pain, palpitations and leg swelling.  Gastrointestinal:  Negative for abdominal distention, abdominal pain, constipation, diarrhea, nausea and vomiting.  Musculoskeletal: Negative.   Skin: Negative.   Neurological: Negative.   Psychiatric/Behavioral: Negative.      Objective:  Physical Exam Constitutional:      Appearance: She is well-developed.  HENT:  Head: Normocephalic and atraumatic.  Cardiovascular:     Rate and Rhythm: Normal rate and regular rhythm.  Pulmonary:     Effort: Pulmonary effort is normal. No respiratory distress.     Breath sounds: Normal breath sounds. No wheezing or rales.  Abdominal:     General: Bowel sounds are normal. There is no distension.     Palpations: Abdomen is soft.     Tenderness: There is no abdominal tenderness. There is no rebound.  Musculoskeletal:     Cervical back: Normal range of motion.  Skin:    General: Skin is warm and dry.  Neurological:     Mental Status: She is alert and oriented to person, place, and time.     Coordination: Coordination normal.     Vitals:   10/21/22 1426 10/21/22 1444  BP: (!) 180/80 (!) 179/85  Pulse: 79    Temp: 98.6 F (37 C)   TempSrc: Oral   SpO2: 97%   Weight: 169 lb (76.7 kg)   Height: 5\' 6"  (1.676 m)     Assessment & Plan:

## 2023-10-13 ENCOUNTER — Other Ambulatory Visit: Payer: Self-pay | Admitting: Internal Medicine

## 2023-10-20 ENCOUNTER — Other Ambulatory Visit: Payer: Self-pay | Admitting: Internal Medicine

## 2023-10-22 ENCOUNTER — Ambulatory Visit (INDEPENDENT_AMBULATORY_CARE_PROVIDER_SITE_OTHER)

## 2023-10-22 VITALS — BP 150/80 | HR 79 | Ht 65.5 in | Wt 167.0 lb

## 2023-10-22 DIAGNOSIS — Z1231 Encounter for screening mammogram for malignant neoplasm of breast: Secondary | ICD-10-CM

## 2023-10-22 DIAGNOSIS — Z0000 Encounter for general adult medical examination without abnormal findings: Secondary | ICD-10-CM | POA: Diagnosis not present

## 2023-10-22 NOTE — Patient Instructions (Signed)
 [name] , Thank you for taking time out of your busy schedule to complete your Annual Wellness Visit with me. I enjoyed our conversation and look forward to speaking with you again next year. I, as well as your care team,  appreciate your ongoing commitment to your health goals. Please review the following plan we discussed and let me know if I can assist you in the future. Your Game plan/ To Do List    Referrals: If you haven't heard from the office you've been referred to, please reach out to them at the phone provided.  You have an order for:  [x]   2D Mammogram  [x]   3D Mammogram    Please call for appointment:  [facility] [address] [place], [place] [address] [phone]  Make sure to wear two-piece clothing.  No lotions, powders, or deodorants the day of the appointment. Make sure to bring picture ID and insurance card.  Bring list of medications you are currently taking including any supplements.    Follow up Visits: We will see or speak with you next year for your Next Medicare AWV with our clinical staff Have you seen your provider in the last 6 months (3 months if uncontrolled diabetes)? No.  Next office visit on 10/27/2023.  Clinician Recommendations:  Aim for 30 minutes of exercise or brisk walking, 6-8 glasses of water, and 5 servings of fruits and vegetables each day. You will be due for a colonoscopy by next summer/June 2026, remember to ask provider about that screening.   For your eye screening, please call:  [facility]  Located in: [facility] Address: [address] [place], [place], [place] [address] Phone: [phone]      This is a list of the screenings recommended for you:  Health Maintenance  Topic Date Due   DTaP/Tdap/Td vaccine (1 - Tdap) Never done   Zoster (Shingles) Vaccine (1 of 2) Never done   Pneumococcal Vaccine for age over 65 (2 of 2 - PPSV23, PCV20, or PCV21) 09/16/2016   Mammogram  08/02/2018   COVID-19  Vaccine (3 - 2024-25 season) 11/09/2022   Flu Shot  10/09/2023   Colon Cancer Screening  08/23/2024   Medicare Annual Wellness Visit  10/21/2024   DEXA scan (bone density measurement)  Completed   Hepatitis C Screening  Completed   HPV Vaccine  Aged Out   Meningitis B Vaccine  Aged Out    Advanced directives: (Declined) Advance directive discussed with you today. Even though you declined this today, please call our office should you change your mind, and we can give you the proper paperwork for you to fill out. Advance Care Planning is important because it:  [x]  Makes sure you receive the medical care that is consistent with your values, goals, and preferences  [x]  It provides guidance to your family and loved ones and reduces their decisional burden about whether or not they are making the right decisions based on your wishes.  Follow the link provided in your after visit summary or read over the paperwork we have mailed to you to help you started getting your Advance Directives in place. If you need assistance in completing these, please reach out to us  so that we can help you!  See attachments for Preventive Care and Fall Prevention Tips.

## 2023-10-22 NOTE — Progress Notes (Addendum)
 Subjective:   Kendra Jordan is a 73 y.o. who presents for a Medicare Wellness preventive visit.  As a reminder, Annual Wellness Visits don't include a physical exam, and some assessments may be limited, especially if this visit is performed virtually. We may recommend an in-person follow-up visit with your provider if needed.  Visit Complete: In person  Persons Participating in Visit: Patient.  AWV Questionnaire: No: Patient Medicare AWV questionnaire was not completed prior to this visit.  Cardiac Risk Factors include: advanced age (>76men, >33 women);hypertension;dyslipidemia     Objective:    Today's Vitals   10/22/23 1418  Weight: 167 lb (75.8 kg)  Height: 5' 5.5 (1.664 m)   Body mass index is 27.37 kg/m.     10/22/2023    2:25 PM 08/24/2014   10:42 AM 08/10/2014    1:05 PM  Advanced Directives  Does Patient Have a Medical Advance Directive? No No  No   Would patient like information on creating a medical advance directive?   No - patient declined information      Data saved with a previous flowsheet row definition    Current Medications (verified) Outpatient Encounter Medications as of 10/22/2023  Medication Sig   atorvastatin  (LIPITOR) 20 MG tablet Take 1 tablet (20 mg total) by mouth daily.   citalopram  (CELEXA ) 40 MG tablet TAKE 1 TABLET(40 MG) BY MOUTH DAILY   losartan -hydrochlorothiazide  (HYZAAR) 50-12.5 MG tablet TAKE 1 TABLET EVERY DAY   OVER THE COUNTER MEDICATION Take 1 tablet by mouth daily. Anxiety and Stress Relief   triamcinolone  cream (KENALOG ) 0.1 % Apply 1 Application topically 2 (two) times daily.   Vitamin D , Ergocalciferol , (DRISDOL ) 1.25 MG (50000 UNIT) CAPS capsule Take 1 capsule (50,000 Units total) by mouth every 7 (seven) days. (Patient not taking: Reported on 10/22/2023)   No facility-administered encounter medications on file as of 10/22/2023.    Allergies (verified) Penicillins   History: Past Medical History:  Diagnosis Date    Allergy    Arthritis    GERD (gastroesophageal reflux disease)    Hyperlipidemia    Hypertension    Past Surgical History:  Procedure Laterality Date   COLONOSCOPY     UPPER GASTROINTESTINAL ENDOSCOPY     WISDOM TOOTH EXTRACTION     with sedation   Family History  Problem Relation Age of Onset   Colon cancer Other        half sister deceased   Throat cancer Daughter    Arthritis Mother    Hyperlipidemia Mother    Heart disease Mother    Hypertension Mother    Diabetes Mother    Arthritis Father    Heart disease Father    Hypertension Father    Kidney disease Father    Rectal cancer Neg Hx    Stomach cancer Neg Hx    Esophageal cancer Neg Hx    Social History   Socioeconomic History   Marital status: Divorced    Spouse name: Not on file   Number of children: 1   Years of education: Not on file   Highest education level: Not on file  Occupational History   Occupation: RETIRED  Tobacco Use   Smoking status: Every Day    Current packs/day: 1.00    Types: Cigarettes   Smokeless tobacco: Never  Vaping Use   Vaping status: Never Used  Substance and Sexual Activity   Alcohol use: No    Alcohol/week: 0.0 standard drinks of alcohol   Drug  use: No   Sexual activity: Not on file  Other Topics Concern   Not on file  Social History Narrative   Lives alone/2025   Social Drivers of Health   Financial Resource Strain: Low Risk  (10/22/2023)   Overall Financial Resource Strain (CARDIA)    Difficulty of Paying Living Expenses: Not very hard  Food Insecurity: No Food Insecurity (10/22/2023)   Hunger Vital Sign    Worried About Running Out of Food in the Last Year: Never true    Ran Out of Food in the Last Year: Never true  Transportation Needs: No Transportation Needs (10/22/2023)   PRAPARE - Administrator, Civil Service (Medical): No    Lack of Transportation (Non-Medical): No  Physical Activity: Inactive (10/22/2023)   Exercise Vital Sign    Days of  Exercise per Week: 0 days    Minutes of Exercise per Session: 0 min  Stress: Stress Concern Present (10/22/2023)   [employer] of Occupational Health - Occupational Stress Questionnaire    Feeling of Stress: Rather much  Social Connections: Moderately Isolated (10/22/2023)   Social Connection and Isolation Panel    Frequency of Communication with Friends and Family: Once a week    Frequency of Social Gatherings with Friends and Family: Once a week    Attends Religious Services: More than 4 times per year    Active Member of [employer] or Organizations: Yes    Attends Banker Meetings: Never    Marital Status: Divorced    Tobacco Counseling Ready to quit: Not Answered Counseling given: Not Answered    Clinical Intake:  Pre-visit preparation completed: Yes  Pain : No/denies pain     BMI - recorded: 27.37 Nutritional Status: BMI 25 -29 Overweight Nutritional Risks: None Diabetes: No  Lab Results  Component Value Date   HGBA1C 5.6 10/21/2022   HGBA1C 5.7 11/05/2021   HGBA1C 5.6 05/18/2019     How often do you need to have someone help you when you read instructions, pamphlets, or other written materials from your doctor or pharmacy?: 1 - Never     Information entered by :: Shaniquia Brafford, RMA   Activities of Daily Living     10/22/2023    2:10 PM  In your present state of health, do you have any difficulty performing the following activities:  Hearing? 0  Vision? 0  Difficulty concentrating or making decisions? 0  Walking or climbing stairs? 0  Dressing or bathing? 0  Doing errands, shopping? 0  Preparing Food and eating ? N  Using the Toilet? N  In the past six months, have you accidently leaked urine? N  Do you have problems with loss of bowel control? N  Managing your Medications? N  Managing your Finances? N  Housekeeping or managing your Housekeeping? N    Patient Care Team: Rollene Almarie LABOR, MD as PCP - General (Internal  Medicine)  I have updated your Care Teams any recent Medical Services you may have received from other providers in the past year.     Assessment:   This is a routine wellness examination for [place].  Hearing/Vision screen Hearing Screening - Comments:: Denies hearing difficulties   Vision Screening - Comments:: Denies vision issues.estil no eye doctor    Goals Addressed               This Visit's Progress     Patient Stated (pt-stated)        Would like to  be active/2025       Depression Screen     10/22/2023    2:28 PM 07/24/2022   10:28 AM 08/30/2021    9:30 AM 08/01/2020    9:11 AM 05/19/2019    8:57 AM 01/02/2016   10:28 AM  PHQ 2/9 Scores  PHQ - 2 Score 1 0 0 6 2 0  PHQ- 9 Score 1 0 0 24 5     Fall Risk     10/22/2023    2:26 PM 10/21/2022    2:28 PM 07/24/2022   10:28 AM 08/30/2021    9:30 AM 08/01/2020    9:11 AM  Fall Risk   Falls in the past year? 0 0 0 0 0  Number falls in past yr: 0 0 0 0 0  Injury with Fall? 0 0 0 0 0  Follow up Falls evaluation completed;Falls prevention discussed Falls evaluation completed Falls evaluation completed      MEDICARE RISK AT HOME:  Medicare Risk at Home Any stairs in or around the home?: Yes If so, are there any without handrails?: No Home free of loose throw rugs in walkways, pet beds, electrical cords, etc?: Yes Adequate lighting in your home to reduce risk of falls?: Yes Life alert?: No Use of a cane, walker or w/c?: No Grab bars in the bathroom?: Yes Shower chair or bench in shower?: Yes Elevated toilet seat or a handicapped toilet?: Yes  TIMED UP AND GO:  Was the test performed?  Yes  Length of time to ambulate 10 feet: 15 sec Gait steady and fast without use of assistive device  Cognitive Function: Declined/Normal: No cognitive concerns noted by patient or family. Patient alert, oriented, able to answer questions appropriately and recall recent events. No signs of memory loss or confusion.         Immunizations Immunization History  Administered Date(s) Administered   PFIZER(Purple Top)SARS-COV-2 Vaccination 09/13/2019, 10/04/2019   Pneumococcal Conjugate-13 07/22/2016    Screening Tests Health Maintenance  Topic Date Due   DTaP/Tdap/Td (1 - Tdap) Never done   Zoster Vaccines- Shingrix (1 of 2) Never done   Pneumococcal Vaccine: 50+ Years (2 of 2 - PPSV23, PCV20, or PCV21) 09/16/2016   MAMMOGRAM  08/02/2018   COVID-19 Vaccine (3 - 2024-25 season) 11/09/2022   INFLUENZA VACCINE  10/09/2023   Colonoscopy  08/23/2024   Medicare Annual Wellness (AWV)  10/21/2024   DEXA SCAN  Completed   Hepatitis C Screening  Completed   HPV VACCINES  Aged Out   Meningococcal B Vaccine  Aged Out    Health Maintenance  Health Maintenance Due  Topic Date Due   DTaP/Tdap/Td (1 - Tdap) Never done   Zoster Vaccines- Shingrix (1 of 2) Never done   Pneumococcal Vaccine: 50+ Years (2 of 2 - PPSV23, PCV20, or PCV21) 09/16/2016   MAMMOGRAM  08/02/2018   COVID-19 Vaccine (3 - 2024-25 season) 11/09/2022   INFLUENZA VACCINE  10/09/2023   Health Maintenance Items Addressed: Mammogram ordered, See Nurse Notes at the end of this note  Additional Screening:  Vision Screening: Recommended annual ophthalmology exams for early detection of glaucoma and other disorders of the eye. Would you like a referral to an eye doctor? Yes    Dental Screening: Recommended annual dental exams for proper oral hygiene  Community Resource Referral / Chronic Care Management: CRR required this visit?  No   CCM required this visit?  No   Plan:    I have personally reviewed  and noted the following in the patient's chart:   Medical and social history Use of alcohol, tobacco or illicit drugs  Current medications and supplements including opioid prescriptions. Patient is not currently taking opioid prescriptions. Functional ability and status Nutritional status Physical activity Advanced directives List of  other physicians Hospitalizations, surgeries, and ER visits in previous 12 months Vitals Screenings to include cognitive, depression, and falls Referrals and appointments  In addition, I have reviewed and discussed with patient certain preventive protocols, quality metrics, and best practice recommendations. A written personalized care plan for preventive services as well as general preventive health recommendations were provided to patient.   Triston Skare L Ciarrah Rae, CMA   10/22/2023   After Visit Summary: (In Person-Printed) AVS printed and given to the patient  Notes: Patient is due for a mammogram and order has been placed today.  Patient declines any vaccine for now.  Patient stated that she has concerns and a tingling sensation in her lt hand x 6weeks.  She would like discuss with provider during her next office visit.    Medical screening examination/treatment/procedure(s) were performed by non-physician practitioner and as supervising physician I was immediately available for consultation/collaboration.  I agree with above. Karlynn Noel, MD

## 2023-10-27 ENCOUNTER — Ambulatory Visit: Admitting: Internal Medicine

## 2023-10-27 ENCOUNTER — Encounter: Payer: Self-pay | Admitting: Internal Medicine

## 2023-10-27 VITALS — BP 138/84 | HR 84 | Temp 99.0°F | Ht 65.5 in | Wt 166.0 lb

## 2023-10-27 DIAGNOSIS — J410 Simple chronic bronchitis: Secondary | ICD-10-CM

## 2023-10-27 DIAGNOSIS — F3342 Major depressive disorder, recurrent, in full remission: Secondary | ICD-10-CM | POA: Diagnosis not present

## 2023-10-27 DIAGNOSIS — Z0000 Encounter for general adult medical examination without abnormal findings: Secondary | ICD-10-CM | POA: Diagnosis not present

## 2023-10-27 DIAGNOSIS — R7301 Impaired fasting glucose: Secondary | ICD-10-CM

## 2023-10-27 DIAGNOSIS — E782 Mixed hyperlipidemia: Secondary | ICD-10-CM

## 2023-10-27 DIAGNOSIS — I10 Essential (primary) hypertension: Secondary | ICD-10-CM

## 2023-10-27 LAB — COMPREHENSIVE METABOLIC PANEL WITH GFR
ALT: 12 U/L (ref 0–35)
AST: 20 U/L (ref 0–37)
Albumin: 4 g/dL (ref 3.5–5.2)
Alkaline Phosphatase: 80 U/L (ref 39–117)
BUN: 12 mg/dL (ref 6–23)
CO2: 25 meq/L (ref 19–32)
Calcium: 9.3 mg/dL (ref 8.4–10.5)
Chloride: 104 meq/L (ref 96–112)
Creatinine, Ser: 0.84 mg/dL (ref 0.40–1.20)
GFR: 68.44 mL/min (ref >60.00)
Glucose, Bld: 109 mg/dL — ABNORMAL HIGH (ref 70–99)
Potassium: 3.6 meq/L (ref 3.5–5.1)
Sodium: 137 meq/L (ref 135–145)
Total Bilirubin: 0.4 mg/dL (ref 0.2–1.2)
Total Protein: 8.8 g/dL — ABNORMAL HIGH (ref 6.0–8.3)

## 2023-10-27 LAB — CBC
HCT: 37.7 % (ref 36.0–46.0)
Hemoglobin: 12.5 g/dL (ref 12.0–15.0)
MCHC: 33.3 g/dL (ref 30.0–36.0)
MCV: 99.9 fl (ref 78.0–100.0)
Platelets: 226 K/uL (ref 150.0–400.0)
RBC: 3.77 Mil/uL — ABNORMAL LOW (ref 3.87–5.11)
RDW: 14.4 % (ref 11.5–15.5)
WBC: 6.9 K/uL (ref 4.0–10.5)

## 2023-10-27 LAB — LIPID PANEL
Cholesterol: 132 mg/dL (ref 0–200)
HDL: 42.6 mg/dL (ref >39.00)
LDL Cholesterol: 72 mg/dL (ref 0–99)
NonHDL: 89.44
Total CHOL/HDL Ratio: 3
Triglycerides: 87 mg/dL (ref 0.0–149.0)
VLDL: 17.4 mg/dL (ref 0.0–40.0)

## 2023-10-27 LAB — HEMOGLOBIN A1C: Hgb A1c MFr Bld: 5.8 % (ref 4.6–6.5)

## 2023-10-27 MED ORDER — LOSARTAN POTASSIUM-HCTZ 50-12.5 MG PO TABS: 1.0000 | ORAL_TABLET | Freq: Every day | ORAL | 3 refills | Status: AC

## 2023-10-27 MED ORDER — ATORVASTATIN CALCIUM 20 MG PO TABS: 20.0000 mg | ORAL_TABLET | Freq: Every day | ORAL | 3 refills | Status: DC

## 2023-10-27 MED ORDER — CITALOPRAM HYDROBROMIDE 40 MG PO TABS: 40.0000 mg | ORAL_TABLET | Freq: Every day | ORAL | 3 refills | Status: AC

## 2023-10-27 NOTE — Assessment & Plan Note (Signed)
 Flu shot declines. Pneumonia declines. Shingrix declines. Tetanus declines. Colonoscopy due 2026. Mammogram due ordered, pap smear aged out and dexa complete. Counseled about sun safety and mole surveillance. Counseled about the dangers of distracted driving. Given 10 year screening recommendations.

## 2023-10-27 NOTE — Assessment & Plan Note (Signed)
 Ecking lipid panel and adjust lipitor as needed.

## 2023-10-27 NOTE — Assessment & Plan Note (Signed)
 BP borderline today and recheck . Checking CMP and adjust losartan /hydrochlorothiazide  50/12.5 mg daily as needed.

## 2023-10-27 NOTE — Patient Instructions (Signed)
 We will check the labs today.

## 2023-10-27 NOTE — Progress Notes (Signed)
   Subjective:   Patient ID: Kendra Jordan, female    DOB: [birth year], 73 y.o.   MRN: [id number]  The patient is here for physical. Pertinent topics discussed: Discussed the use of AI scribe software for clinical note transcription with the patient, who gave verbal consent to proceed.  History of Present Illness Kendra Jordan is a 73 year old female who presents with chronic back pain and recent numbness in the left leg.  She experiences chronic back pain that is constant, with a recent episode of numbness radiating down her left leg, making it difficult to walk. This episode lasted part of the day, but she was able to walk normally the following morning after rubbing her back overnight. She recalls having tingling in her right leg a couple of years ago, but currently experiences tingling in her hand instead.  She has reduced her smoking to about one pack every day and a half to two days.  No new chest pain, tightness, pressure, heart racing, breathing troubles, or persistent coughing. She occasionally experiences constipation, but it is not frequent. She denies diarrhea, heartburn, or blood in her stool. No new joint or muscle pain, headaches, migraines, skin changes, or lumps and bumps in her legs.  PMH, [facility], social history reviewed and updated  Review of Systems  Constitutional: Negative.   HENT: Negative.    Eyes: Negative.   Respiratory:  Negative for cough, chest tightness and shortness of breath.   Cardiovascular:  Negative for chest pain, palpitations and leg swelling.  Gastrointestinal:  Negative for abdominal distention, abdominal pain, constipation, diarrhea, nausea and vomiting.  Musculoskeletal:  Positive for back pain.  Skin: Negative.   Neurological: Negative.   Psychiatric/Behavioral: Negative.      Objective:  Physical Exam Constitutional:      Appearance: She is well-developed.  HENT:     Head: Normocephalic and atraumatic.  Cardiovascular:     Rate and  Rhythm: Normal rate and regular rhythm.  Pulmonary:     Effort: Pulmonary effort is normal. No respiratory distress.     Breath sounds: Normal breath sounds. No wheezing or rales.  Abdominal:     General: Bowel sounds are normal. There is no distension.     Palpations: Abdomen is soft.     Tenderness: There is no abdominal tenderness. There is no rebound.  Musculoskeletal:     Cervical back: Normal range of motion.  Skin:    General: Skin is warm and dry.  Neurological:     Mental Status: She is alert and oriented to person, place, and time.     Coordination: Coordination normal.     Vitals:   10/27/23 1318  BP: (!) 142/80  Pulse: 84  Temp: 99 F (37.2 C)  TempSrc: Oral  SpO2: 98%  Weight: 166 lb (75.3 kg)  Height: 5' 5.5 (1.664 m)    Assessment & Plan:

## 2023-10-27 NOTE — Assessment & Plan Note (Signed)
 With chronic cough on exam. She denies shortness of breath and states she does not want to quit due to feeling like that will cause her to need oxygen. Discussed basic changes from smoking and progression and need for oxygen as a potential future given her smoking history. Not able to make attempt today.

## 2023-10-27 NOTE — Assessment & Plan Note (Addendum)
 Major depression in nature and needs celexa  40 mg daily to control symptoms. She is in remission currently with medication.

## 2023-10-28 ENCOUNTER — Ambulatory Visit: Payer: Self-pay | Admitting: Internal Medicine

## 2023-11-04 ENCOUNTER — Other Ambulatory Visit: Payer: Self-pay | Admitting: Internal Medicine

## 2023-11-20 ENCOUNTER — Ambulatory Visit
Admission: RE | Admit: 2023-11-20 | Discharge: 2023-11-20 | Disposition: A | Discharge status: Home / Self Care | Source: Ambulatory Visit | Attending: Internal Medicine | Admitting: Internal Medicine

## 2023-11-20 DIAGNOSIS — Z1231 Encounter for screening mammogram for malignant neoplasm of breast: Secondary | ICD-10-CM

## 2023-12-21 DIAGNOSIS — H2513 Age-related nuclear cataract, bilateral: Secondary | ICD-10-CM | POA: Diagnosis not present

## 2023-12-21 DIAGNOSIS — H52223 Regular astigmatism, bilateral: Secondary | ICD-10-CM | POA: Diagnosis not present

## 2023-12-21 DIAGNOSIS — H5015 Alternating exotropia: Secondary | ICD-10-CM | POA: Diagnosis not present

## 2023-12-21 DIAGNOSIS — H524 Presbyopia: Secondary | ICD-10-CM | POA: Diagnosis not present

## 2023-12-21 DIAGNOSIS — H5203 Hypermetropia, bilateral: Secondary | ICD-10-CM | POA: Diagnosis not present

## 2023-12-21 DIAGNOSIS — H25013 Cortical age-related cataract, bilateral: Secondary | ICD-10-CM | POA: Diagnosis not present

## 2024-01-18 ENCOUNTER — Other Ambulatory Visit: Payer: Self-pay | Admitting: Internal Medicine
